# Patient Record
Sex: Male | Born: 1940 | Race: Black or African American | Hispanic: No | Marital: Married | Smoking: Never smoker
Health system: Southern US, Community
[De-identification: ages and names within clinical notes are randomized; demographics above are authoritative.]

## PROBLEM LIST (undated history)

## (undated) DIAGNOSIS — N529 Male erectile dysfunction, unspecified: Secondary | ICD-10-CM

## (undated) DIAGNOSIS — K5900 Constipation, unspecified: Secondary | ICD-10-CM

## (undated) HISTORY — PX: FOOT SURGERY: SHX648

## (undated) HISTORY — DX: Constipation, unspecified: K59.00

## (undated) HISTORY — PX: SHOULDER SURGERY: SHX246

## (undated) HISTORY — DX: Male erectile dysfunction, unspecified: N52.9

---

## 2004-11-05 ENCOUNTER — Ambulatory Visit (HOSPITAL_COMMUNITY): Admission: RE | Admit: 2004-11-05 | Discharge: 2004-11-05 | Payer: Self-pay | Admitting: Gastroenterology

## 2013-08-12 DIAGNOSIS — IMO0002 Reserved for concepts with insufficient information to code with codable children: Secondary | ICD-10-CM | POA: Insufficient documentation

## 2013-08-12 DIAGNOSIS — E538 Deficiency of other specified B group vitamins: Secondary | ICD-10-CM | POA: Insufficient documentation

## 2013-08-12 DIAGNOSIS — I10 Essential (primary) hypertension: Secondary | ICD-10-CM | POA: Insufficient documentation

## 2013-08-12 DIAGNOSIS — E785 Hyperlipidemia, unspecified: Secondary | ICD-10-CM | POA: Insufficient documentation

## 2013-08-12 DIAGNOSIS — M545 Low back pain, unspecified: Secondary | ICD-10-CM | POA: Insufficient documentation

## 2013-08-20 ENCOUNTER — Telehealth: Payer: Self-pay | Admitting: *Deleted

## 2013-08-20 NOTE — Telephone Encounter (Signed)
Having pain in my right foot.  In my right big toe has fungus.  Dr. Paulla Dolly is out of the office.  I was wondering if I could see another doctor.  I returned his call.  He stated he has some toenails that have fungus in them.  He stated the big toenail is starting to bother him and it has white stuff underneath it.  I transferred him to a scheduler for an appointment.

## 2013-08-21 ENCOUNTER — Ambulatory Visit (INDEPENDENT_AMBULATORY_CARE_PROVIDER_SITE_OTHER): Payer: Medicare Other | Admitting: Podiatry

## 2013-08-21 ENCOUNTER — Encounter: Payer: Self-pay | Admitting: Podiatry

## 2013-08-21 VITALS — BP 163/92 | HR 78 | Resp 16 | Ht 77.0 in | Wt 258.0 lb

## 2013-08-21 DIAGNOSIS — L608 Other nail disorders: Secondary | ICD-10-CM

## 2013-08-21 DIAGNOSIS — M79609 Pain in unspecified limb: Secondary | ICD-10-CM

## 2013-08-21 DIAGNOSIS — M79676 Pain in unspecified toe(s): Secondary | ICD-10-CM

## 2013-08-21 NOTE — Patient Instructions (Signed)

## 2013-08-21 NOTE — Progress Notes (Signed)
   Subjective:    Patient ID: Douglas Proctor, male    DOB: Jun 30, 1940, 73 y.o.   MRN: [id number]  HPI Comments: "I have pain in that big toe"  Patient c/o tenderness around 1st toenail right foot for several months. The toenail is thick and dark. Sore with shoes. He just tries to keep trimmed and filed down.     Review of Systems  Musculoskeletal: Positive for arthralgias.  Skin:       Change in nails  Allergic/Immunologic: Positive for food allergies.  All other systems reviewed and are negative.      Objective:   Physical Exam: I have reviewed his past medical history medications allergies surgeries social history and review of systems. Pulses are strongly palpable bilateral. Neurologic sensorium is intact per since once the monofilament. Deep tendon reflexes are intact bilateral. Muscle strength is 5 over 5 dorsiflexors plantar flexors inverters everters all intrinsic musculature is intact. Orthopedic evaluation demonstrates all joints distal to the ankle a full range of motion without crepitus. He has mild hammertoe deformities bilateral that are asymptomatic. Cutaneous evaluation demonstrates thick yellow dystrophic possibly mycotic nails bilateral. These are painful palpation as well as debridement.        Assessment & Plan:  Assessment: Hammertoe deformities. Painful dystrophic nails rule out onychomycosis.  Plan: Also taken today to be sent for pathology evaluation I will followup with him once those come in.

## 2013-08-22 ENCOUNTER — Telehealth: Payer: Self-pay | Admitting: *Deleted

## 2013-08-22 NOTE — Telephone Encounter (Signed)
Mycotic toenail from hallux right foot was sent to [facility] for fungal culture and pas stain.    [facility] [address] [place] [address] Ph:  [phone]

## 2013-09-05 ENCOUNTER — Encounter: Payer: Self-pay | Admitting: Podiatry

## 2013-09-20 ENCOUNTER — Ambulatory Visit (INDEPENDENT_AMBULATORY_CARE_PROVIDER_SITE_OTHER): Payer: Medicare Other | Admitting: Podiatry

## 2013-09-20 ENCOUNTER — Encounter: Payer: Self-pay | Admitting: Podiatry

## 2013-09-20 VITALS — BP 138/90 | HR 70 | Resp 12

## 2013-09-20 DIAGNOSIS — Z79899 Other long term (current) drug therapy: Secondary | ICD-10-CM

## 2013-09-20 LAB — CBC WITH DIFFERENTIAL/PLATELET
Basophils Absolute: 0 10*3/uL (ref 0.0–0.1)
Basophils Relative: 1 % (ref 0–1)
Eosinophils Absolute: 0.2 10*3/uL (ref 0.0–0.7)
Eosinophils Relative: 5 % (ref 0–5)
HCT: 40.3 % (ref 39.0–52.0)
Hemoglobin: 14.3 g/dL (ref 13.0–17.0)
Lymphocytes Relative: 45 % (ref 12–46)
Lymphs Abs: 2.2 10*3/uL (ref 0.7–4.0)
MCH: 31.8 pg (ref 26.0–34.0)
MCHC: 35.5 g/dL (ref 30.0–36.0)
MCV: 89.6 fL (ref 78.0–100.0)
Monocytes Absolute: 0.4 10*3/uL (ref 0.1–1.0)
Monocytes Relative: 8 % (ref 3–12)
Neutro Abs: 2 10*3/uL (ref 1.7–7.7)
Neutrophils Relative %: 41 % — ABNORMAL LOW (ref 43–77)
Platelets: 227 10*3/uL (ref 150–400)
RBC: 4.5 MIL/uL (ref 4.22–5.81)
RDW: 14.2 % (ref 11.5–15.5)
WBC: 4.9 10*3/uL (ref 4.0–10.5)

## 2013-09-20 LAB — HEPATIC FUNCTION PANEL
ALT: 23 U/L (ref 0–53)
AST: 24 U/L (ref 0–37)
Albumin: 4.2 g/dL (ref 3.5–5.2)
Alkaline Phosphatase: 70 U/L (ref 39–117)
Bilirubin, Direct: 0.1 mg/dL (ref 0.0–0.3)
Indirect Bilirubin: 0.3 mg/dL (ref 0.2–1.2)
Total Bilirubin: 0.4 mg/dL (ref 0.2–1.2)
Total Protein: 7.4 g/dL (ref 6.0–8.3)

## 2013-09-20 MED ORDER — TERBINAFINE HCL 250 MG PO TABS: 250.0000 mg | ORAL_TABLET | Freq: Every day | ORAL | Status: DC

## 2013-09-20 NOTE — Progress Notes (Signed)
He presents today for toenail culture report.  Objective: Vital signs are stable he is alert and oriented x3 pulses remain palpable bilateral. Positive fungal culture for onychomycosis.  Assessment: Onychomycosis bilateral.  Plan: Started him a Lamisil today 250 mg tablets 1 by mouth daily #30 with no refill. Also requested blood work CBC and liver profile. Should this come back abnormal we will notify him immediately otherwise I will followup with him in one month at which time another prescription for medication will be written as well as a requisition for more blood work.

## 2013-09-24 ENCOUNTER — Telehealth: Payer: Self-pay | Admitting: *Deleted

## 2013-09-24 NOTE — Telephone Encounter (Signed)
Spoke to patient regarding bloodwork.

## 2013-09-24 NOTE — Telephone Encounter (Signed)
Message copied by Dierdre Searles on Mon Sep 24, 2013  2:13 PM ------      Message from: HYATT, MAX T      Created: Mon Sep 24, 2013  8:59 AM       Blood work looks good. ------

## 2013-10-23 ENCOUNTER — Ambulatory Visit (INDEPENDENT_AMBULATORY_CARE_PROVIDER_SITE_OTHER): Payer: Medicare Other | Admitting: Podiatry

## 2013-10-23 ENCOUNTER — Encounter: Payer: Self-pay | Admitting: Podiatry

## 2013-10-23 VITALS — BP 150/80 | HR 91 | Resp 16

## 2013-10-23 DIAGNOSIS — Z79899 Other long term (current) drug therapy: Secondary | ICD-10-CM

## 2013-10-23 LAB — CBC WITH DIFFERENTIAL/PLATELET
Basophils Absolute: 0 10*3/uL (ref 0.0–0.1)
Basophils Relative: 1 % (ref 0–1)
Eosinophils Absolute: 0.2 10*3/uL (ref 0.0–0.7)
Eosinophils Relative: 5 % (ref 0–5)
HCT: 41.8 % (ref 39.0–52.0)
Hemoglobin: 14.5 g/dL (ref 13.0–17.0)
Lymphocytes Relative: 39 % (ref 12–46)
Lymphs Abs: 1.9 10*3/uL (ref 0.7–4.0)
MCH: 31 pg (ref 26.0–34.0)
MCHC: 34.7 g/dL (ref 30.0–36.0)
MCV: 89.5 fL (ref 78.0–100.0)
Monocytes Absolute: 0.4 10*3/uL (ref 0.1–1.0)
Monocytes Relative: 8 % (ref 3–12)
Neutro Abs: 2.3 10*3/uL (ref 1.7–7.7)
Neutrophils Relative %: 47 % (ref 43–77)
Platelets: 236 10*3/uL (ref 150–400)
RBC: 4.67 MIL/uL (ref 4.22–5.81)
RDW: 14 % (ref 11.5–15.5)
WBC: 4.8 10*3/uL (ref 4.0–10.5)

## 2013-10-23 MED ORDER — TERBINAFINE HCL 250 MG PO TABS: 250.0000 mg | ORAL_TABLET | Freq: Every day | ORAL | Status: DC

## 2013-10-23 NOTE — Progress Notes (Signed)
He presents today for followup of his Lamisil therapy he has taken the first 30 days. He denies fever chills nausea vomiting muscle aches pains itching or rashes.  Objective: Vital signs are stable he is alert oriented x3. There is no erythema edema cellulitis drainage or odor no change in nail plates as of yet.  Assessment: Onychomycosis bilateral.  Plan: Continue Lamisil therapy for another 90 days. I will followup with him in 4 months. Blood work be performed consisting of a liver profile and CBC will notify him should this come back abnormal.

## 2013-10-24 LAB — HEPATIC FUNCTION PANEL
ALT: 30 U/L (ref 0–53)
AST: 28 U/L (ref 0–37)
Albumin: 4.5 g/dL (ref 3.5–5.2)
Alkaline Phosphatase: 66 U/L (ref 39–117)
Bilirubin, Direct: 0.1 mg/dL (ref 0.0–0.3)
Indirect Bilirubin: 0.2 mg/dL (ref 0.2–1.2)
Total Bilirubin: 0.3 mg/dL (ref 0.2–1.2)
Total Protein: 7.7 g/dL (ref 6.0–8.3)

## 2013-10-30 ENCOUNTER — Telehealth: Payer: Self-pay | Admitting: *Deleted

## 2013-10-30 NOTE — Telephone Encounter (Signed)
I called and left the patient a message that Dr. Milinda Pointer said your blood work looks good.  You can continue your medication.

## 2013-10-30 NOTE — Telephone Encounter (Signed)
Message copied by Lolita Rieger on Tue Oct 30, 2013 11:56 AM ------      Message from: Clovis Riley E      Created: Thu Oct 25, 2013  1:47 PM                   ----- Message -----         From: Garrel Ridgel, DPM         Sent: 10/24/2013  12:05 PM           To: Avanell Shackleton Prevette, PMAC            Blood work looks good. Continue medication. ------

## 2013-11-14 DIAGNOSIS — E119 Type 2 diabetes mellitus without complications: Secondary | ICD-10-CM | POA: Insufficient documentation

## 2013-11-14 DIAGNOSIS — R7303 Prediabetes: Secondary | ICD-10-CM | POA: Insufficient documentation

## 2014-02-19 ENCOUNTER — Ambulatory Visit: Payer: Medicare Other | Admitting: Podiatry

## 2015-05-26 ENCOUNTER — Ambulatory Visit (INDEPENDENT_AMBULATORY_CARE_PROVIDER_SITE_OTHER): Payer: Medicare Other

## 2015-05-26 ENCOUNTER — Encounter: Payer: Self-pay | Admitting: Podiatry

## 2015-05-26 ENCOUNTER — Ambulatory Visit (INDEPENDENT_AMBULATORY_CARE_PROVIDER_SITE_OTHER): Payer: Medicare Other | Admitting: Podiatry

## 2015-05-26 VITALS — BP 164/87 | HR 77 | Resp 16

## 2015-05-26 DIAGNOSIS — M79676 Pain in unspecified toe(s): Secondary | ICD-10-CM

## 2015-05-26 DIAGNOSIS — L600 Ingrowing nail: Secondary | ICD-10-CM | POA: Diagnosis not present

## 2015-05-26 DIAGNOSIS — M898X9 Other specified disorders of bone, unspecified site: Secondary | ICD-10-CM

## 2015-05-26 NOTE — Patient Instructions (Signed)

## 2015-05-27 NOTE — Progress Notes (Signed)
Subjective:     Patient ID: Douglas Proctor, male   DOB: 03-Mar-1940, 75 y.o.   MRN: [id number]  HPI patient presents stating my surgery spine from previously but I developed an ingrown toenail my left big toe that get sore couple months and I've tried to trim it and soak it   Review of Systems  All other systems reviewed and are negative.      Objective:   Physical Exam  Constitutional: He is oriented to person, place, and time.  Cardiovascular: Intact distal pulses.   Musculoskeletal: Normal range of motion.  Neurological: He is oriented to person, place, and time.  Skin: Skin is warm.  Nursing note and vitals reviewed.  neurovascular status intact muscle strength adequate range of motion within normal limits with patient found to have good digital perfusion well oriented 3. Patient's found to have incurvated medial border left hallux that's painful when pressed and makes wearing shoe gear difficult with distal irritation of the tissue     Assessment:     Ingrown toenail deformity left hallux medial border with pain    Plan:     H&P condition reviewed with patient and recommended procedure. Explained risk of procedure and went ahead today and infiltrated the left hallux 60 mg like Marcaine mixture remove the medial border exposed matrix and applied phenol 3 applications 30 seconds followed by alcohol lavage and sterile dressing. Gave instructions on soaks and reappoint

## 2015-06-03 ENCOUNTER — Telehealth: Payer: Self-pay | Admitting: *Deleted

## 2015-06-03 NOTE — Telephone Encounter (Signed)
Called patient at [phone] (Home #) to check to see how they were doing from their ingrown toenail procedure that was performed on Monday, May 26, 2015. Pt stated, "Feeling good and has no pain". Pt is soaking toe with some relief.

## 2016-03-02 ENCOUNTER — Emergency Department (HOSPITAL_COMMUNITY): Payer: Medicare Other

## 2016-03-02 ENCOUNTER — Emergency Department (HOSPITAL_COMMUNITY)
Admission: EM | Admit: 2016-03-02 | Discharge: 2016-03-02 | Disposition: A | Discharge status: Home / Self Care | Payer: Medicare Other | Attending: Emergency Medicine | Admitting: Emergency Medicine

## 2016-03-02 DIAGNOSIS — I10 Essential (primary) hypertension: Secondary | ICD-10-CM | POA: Insufficient documentation

## 2016-03-02 DIAGNOSIS — Z23 Encounter for immunization: Secondary | ICD-10-CM | POA: Insufficient documentation

## 2016-03-02 DIAGNOSIS — Y939 Activity, unspecified: Secondary | ICD-10-CM | POA: Diagnosis not present

## 2016-03-02 DIAGNOSIS — E119 Type 2 diabetes mellitus without complications: Secondary | ICD-10-CM | POA: Diagnosis not present

## 2016-03-02 DIAGNOSIS — S0181XA Laceration without foreign body of other part of head, initial encounter: Secondary | ICD-10-CM | POA: Diagnosis not present

## 2016-03-02 DIAGNOSIS — Z79899 Other long term (current) drug therapy: Secondary | ICD-10-CM | POA: Diagnosis not present

## 2016-03-02 DIAGNOSIS — S0990XA Unspecified injury of head, initial encounter: Secondary | ICD-10-CM

## 2016-03-02 DIAGNOSIS — Y999 Unspecified external cause status: Secondary | ICD-10-CM | POA: Insufficient documentation

## 2016-03-02 DIAGNOSIS — Y929 Unspecified place or not applicable: Secondary | ICD-10-CM | POA: Diagnosis not present

## 2016-03-02 DIAGNOSIS — W228XXA Striking against or struck by other objects, initial encounter: Secondary | ICD-10-CM | POA: Diagnosis not present

## 2016-03-02 DIAGNOSIS — R55 Syncope and collapse: Secondary | ICD-10-CM | POA: Diagnosis not present

## 2016-03-02 DIAGNOSIS — S0993XA Unspecified injury of face, initial encounter: Secondary | ICD-10-CM | POA: Diagnosis not present

## 2016-03-02 LAB — CBG MONITORING, ED: Glucose-Capillary: 136 mg/dL — ABNORMAL HIGH (ref 65–99)

## 2016-03-02 LAB — I-STAT CHEM 8, ED
BUN: 17 mg/dL (ref 6–20)
Calcium, Ion: 1.19 mmol/L (ref 1.15–1.40)
Chloride: 103 mmol/L (ref 101–111)
Creatinine, Ser: 1.2 mg/dL (ref 0.61–1.24)
Glucose, Bld: 147 mg/dL — ABNORMAL HIGH (ref 65–99)
HCT: 46 % (ref 39.0–52.0)
Hemoglobin: 15.6 g/dL (ref 13.0–17.0)
Potassium: 3.6 mmol/L (ref 3.5–5.1)
Sodium: 141 mmol/L (ref 135–145)
TCO2: 24 mmol/L (ref 0–100)

## 2016-03-02 MED ORDER — TETANUS-DIPHTH-ACELL PERTUSSIS 5-2.5-18.5 LF-MCG/0.5 IM SUSP
0.5000 mL | Freq: Once | INTRAMUSCULAR | Status: AC
  Administered 2016-03-02: 0.5 mL via INTRAMUSCULAR
  Filled 2016-03-02: qty 0.5

## 2016-03-02 MED ORDER — LIDOCAINE-EPINEPHRINE (PF) 2 %-1:200000 IJ SOLN
20.0000 mL | Freq: Once | INTRAMUSCULAR | Status: AC
  Administered 2016-03-02: 20 mL
  Filled 2016-03-02: qty 20

## 2016-03-02 NOTE — ED Notes (Signed)
Patient transported to CT 

## 2016-03-02 NOTE — Discharge Instructions (Signed)
Suture removal in 7 days  Antibiotic ointment to the wound twice a day  Return to the ER for signs of infection as we discussed

## 2016-03-02 NOTE — ED Provider Notes (Signed)
[facility] DEPT Provider Note   CSN: [id number] Arrival date & time: 03/02/16  1132     History   Chief Complaint No chief complaint on file.   HPI Douglas Proctor is a 76 y.o. male.  HPI Patient presents the emergency department after a syncopal episode where he felt struck his head resulting in laceration of his right forehead.  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  In the [place] the room and stated it felt very hot in the room he became sweaty without chest pain or palpitations. The next thing the pt knew he was laying on the floor.  He also vomited on himself.  He denies nausea vomiting now.  No chest pain or shortness breath.  No palpitations.  He reports mild headache at this time.  He is not on anticoagulants.  He presents with a stellate laceration of his right forehead.  Denies neck pain.  No weakness of his arms or legs   No past medical history on file.  Patient Active Problem List   Diagnosis Date Noted  . 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 Controlled type 2 diabetes mellitus without complication ([place]) 0000000  . Essential (primary) hypertension 08/12/2013  . HLD (hyperlipidemia) 08/12/2013  . LBP (low back pain) 08/12/2013  . B12 deficiency 08/12/2013  . Excess or deficiency of vitamin D 08/12/2013    No past surgical history on file.     Home Medications    Prior to Admission medications   Medication Sig Start Date End Date Taking? Authorizing Provider  amLODipine (NORVASC) 10 MG tablet Take 10 mg by mouth daily.    Historical Provider, MD  atorvastatin (LIPITOR) 10 MG tablet Take 10 mg by mouth daily.    Historical Provider, MD  ibuprofen (ADVIL,MOTRIN) 800 MG tablet  05/07/15   Historical Provider, MD  valsartan-hydrochlorothiazide (DIOVAN-HCT) 320-12.5 MG per tablet Take 1 tablet by mouth daily.    Historical Provider, MD  XALATAN 0.005 % ophthalmic solution  05/07/15   Historical Provider, MD    Family  History No family history on file.  Social History Social History  Substance Use Topics  . Smoking status: Never Smoker  . Smokeless tobacco: Not on file  . Alcohol use Yes     Allergies   Lisinopril; Other; and Oysters [shellfish allergy]   Review of Systems Review of Systems  All other systems reviewed and are negative.    Physical Exam Updated Vital Signs BP 133/92 (BP Location: Left Arm)   Pulse (!) 88  Temp 99 F (37.2 C) (Oral)   Resp 22   Ht 6\' 5"  (1.956 m)   Wt 260 lb (117.9 kg)   SpO2 96%   BMI 30.83 kg/m   Physical Exam  Constitutional: He is oriented to person, place, and time. He appears well-developed and well-nourished.  HENT:  Head: Normocephalic.  Stellate nonbleeding laceration of his right forehead.  Extra movements are normal.  No trismus or malocclusion  Eyes: EOM are normal.  Neck: Normal range of motion.  Cardiovascular: Normal rate, regular rhythm, normal heart sounds and intact distal pulses.   Pulmonary/Chest: Effort normal and breath sounds normal. No respiratory distress.  Abdominal: Soft. He exhibits no distension. There is no tenderness.  Musculoskeletal: Normal range of motion.  Neurological: He is alert and oriented to person, place, and time.  Skin: Skin is warm and dry.  Psychiatric: He has a normal mood and affect. Judgment normal.  Nursing note and  vitals reviewed.    ED Treatments / Results  Labs (all labs ordered are listed, but only abnormal results are displayed) Labs Reviewed  CBG MONITORING, ED - Abnormal; Notable for the following:       Result Value   Glucose-Capillary 136 (*)    All other components within normal limits  I-STAT CHEM 8, ED - Abnormal; Notable for the following:    Glucose, Bld 147 (*)    All other components within normal limits    EKG  EKG Interpretation  Date/Time:  Tuesday March 02 2016 11:55:20 EST Ventricular Rate:  88 PR Interval:    QRS Duration: 109 QT Interval:  381 QTC  Calculation: 461 R Axis:   -24 Text Interpretation:  Sinus rhythm Probable left atrial enlargement Borderline left axis deviation Borderline T abnormalities, anterior leads No old tracing to compare Confirmed by Douglas Smick  MD, Douglas Proctor ([id number]) on 03/02/2016 12:02:01 PM       Radiology Ct Head Wo Contrast  Result Date: 03/02/2016 CLINICAL DATA:  Nausea and vomiting, recent fall hitting the head EXAM: CT HEAD WITHOUT CONTRAST TECHNIQUE: Contiguous axial images were obtained from the base of the skull through the vertex without intravenous contrast. COMPARISON:  None. FINDINGS: Brain: The ventricular system is within normal limits in size for age and the septum is midline in position. Only mild cortical atrophy is present for age. Benign-appearing bilateral basal ganglial calcifications are noted. No hemorrhage, mass lesion, or acute infarction is seen. Vascular: On this unenhanced study, no vascular abnormality is noted. Skull: On bone window images, no calvarial fracture is seen. Sinuses/Orbits: The paranasal sinuses are visualized are pneumatized. Other: A right frontal scalp hematoma is noted. IMPRESSION: 1. Minimal atrophy for age.  No acute intracranial abnormality. 2. Small right frontal scalp hematoma. Electronically Signed   By: Douglas Proctor M.D.   On: 03/02/2016 12:18    Procedures Procedures (including critical care time)  +++++++++++++++++++++++++++++++++++++++++++  LAYERED LACERATION REPAIR Performed by: Hoy Morn Consent: Verbal consent obtained. Risks and benefits: risks, benefits and alternatives were discussed Patient identity confirmed: provided demographic data Time out performed prior to procedure Prepped and Draped in normal sterile fashion Wound explored COMPLEX WOUND LACERATION LAYERED WOUND REPAIR Laceration Location: forehead Laceration Length: 4cm No Foreign Bodies seen or palpated Anesthesia: local infiltration Local anesthetic: lidocaine 2% with  epinephrine Anesthetic total: 8 ml Irrigation method: syringe Amount of cleaning: standard Skin closure: 5-0 vicryl and 5-0 prolene Number of sutures or staples: 4 deep sutures, 9 superficial sutures Technique: simple interrupted  Patient tolerance: Patient tolerated the procedure well with no immediate complications.   +++++++++++++++++++++++++++++++++++++++++++++++++   Medications Ordered in ED Medications  lidocaine-EPINEPHrine (XYLOCAINE W/EPI) 2 %-1:200000 (PF) injection 20 mL (20 mLs Infiltration Given by Other 03/02/16 1252)  Tdap (BOOSTRIX) injection 0.5 mL (0.5 mLs Intramuscular Given 03/02/16 1411)     Initial Impression / Assessment and Plan / ED Course  I have reviewed the triage vital signs and the nursing notes.  Pertinent labs & imaging results that were available during my care of the patient were reviewed by me and considered in my medical decision making (see chart for details).  Clinical Course     Patient is overall well-appearing.  Likely vasovagal syncope.  Laceration repaired.  Head injury negative   Final Clinical Impressions(s) / ED Diagnoses   Final diagnoses:  Syncope, unspecified syncope type  Injury of head, initial encounter  Forehead laceration, initial encounter    New Prescriptions New Prescriptions  No medications on file     Jola Schmidt, MD 03/02/16 1413

## 2016-03-02 NOTE — ED Triage Notes (Signed)
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 Pt started feeling nauseous and hot. Pt stated when he tried to get down off the stool he started vomiting and it caused him to fall over and hit his head on the floor. EMS states pt has a laceration over his right eye. EMS reports pt is AO x 4 and denies pain at this time.

## 2016-03-02 NOTE — ED Notes (Signed)
Bed: [id number] Expected date:  Expected time:  Means of arrival:  Comments: EMS-fall/LOC

## 2019-03-27 ENCOUNTER — Ambulatory Visit (INDEPENDENT_AMBULATORY_CARE_PROVIDER_SITE_OTHER): Payer: Medicare Other | Admitting: Cardiovascular Disease

## 2019-03-27 ENCOUNTER — Encounter: Payer: Self-pay | Admitting: Cardiovascular Disease

## 2019-03-27 ENCOUNTER — Other Ambulatory Visit: Payer: Self-pay

## 2019-03-27 VITALS — BP 148/80 | HR 96 | Temp 98.1°F | Ht 77.0 in | Wt 272.2 lb

## 2019-03-27 DIAGNOSIS — E7800 Pure hypercholesterolemia, unspecified: Secondary | ICD-10-CM

## 2019-03-27 DIAGNOSIS — I10 Essential (primary) hypertension: Secondary | ICD-10-CM

## 2019-03-27 DIAGNOSIS — Z5181 Encounter for therapeutic drug level monitoring: Secondary | ICD-10-CM | POA: Diagnosis not present

## 2019-03-27 MED ORDER — SPIRONOLACTONE 25 MG PO TABS: 25.0000 mg | ORAL_TABLET | Freq: Every day | ORAL | 3 refills | Status: DC

## 2019-03-27 NOTE — Progress Notes (Signed)
Cardiology Office Note   Date:  03/27/2019   ID:  Douglas Proctor, DOB [birth year], MRN [id number]  PCP:  [facility]  Cardiologist:   [name], MD   No chief complaint on file.     History of Present Illness: Douglas Proctor is a 79 y.o. male with hypertension, hyperlipidemia, and who is being seen today for the evaluation of hypertension at the request of Sharmaine Base, [place]*.  He was first diagnosed with hypertension in the 1970s.  It was previously well-controlled prior to the pandemic.  However lately he has not been able to go to the gym.  He stopped going due to fear of getting Covid.  Since that time his blood pressure has started to rise.  He also gained 15 pounds.  Overall he has been feeling well.  He has no exertional chest pain or shortness of breath.  He does have some lower extremity edema that is controlled with compression socks.  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  He denies orthopnea or PND.  He has been seen at [facility], most recently 01/30/19.  At that time his BP was in the 150s/90s.  Terazosin was added to his regimen.  Since that his blood pressure continues to be elevated.  At home it has been in the 130s to the [id number] systolic.  His blood pressure machine is comparable to the readings we obtained in the office.  Of note he reports that 2 of his brothers developed diabetes soon after starting metoprolol.    History reviewed. No pertinent past medical history.  History reviewed. No pertinent surgical history.   Current Outpatient Medications  Medication Sig Dispense Refill  . amLODipine (NORVASC) 10 MG tablet Take 10 mg by mouth daily.    Marland Kitchen ascorbic acid (VITAMIN C) 500 MG/5ML syrup Take by mouth daily.    Marland Kitchen aspirin EC 81 MG tablet Take 81 mg by mouth daily.    Marland Kitchen atorvastatin (LIPITOR) 10 MG tablet Take 10 mg by mouth daily.    Marland Kitchen ibuprofen (ADVIL,MOTRIN) 800 MG tablet Take 800 mg by  mouth every 6 (six) hours as needed for headache or moderate pain.     . Multiple Vitamin (MULTIVITAMIN ADULT PO) Take by mouth.    . valsartan-hydrochlorothiazide (DIOVAN-HCT) 320-12.5 MG per tablet Take 1 tablet by mouth daily.    . vitamin E 200 UNIT capsule Take 200 Units by mouth daily.    Marland Kitchen XALATAN 0.005 % ophthalmic solution Place 1 drop into both eyes at bedtime.     Marland Kitchen REFRESH 1.4-0.6 % ophthalmic solution Place 1-2 drops into both eyes at bedtime as needed (dry eyes).     Marland Kitchen spironolactone (ALDACTONE) 25 MG tablet Take 1 tablet (25 mg total) by mouth daily. 90 tablet 3   No current facility-administered medications for this visit.    Allergies:   Lisinopril, Other, and Oysters [shellfish allergy]    Social History:  The patient  reports that he has never smoked. He has never used smokeless tobacco. He reports current alcohol use of about 1.0 standard drinks of alcohol per week.   Family History:  The patient's family history includes Bradycardia in his sister; CAD in his father.    ROS:  Please see the history of present illness.   Otherwise, review of systems are positive for none.   All other systems are reviewed and negative.    PHYSICAL EXAM: VS:  BP (!) 148/80   Pulse 96   Temp 98.1 F (36.7 C)   Ht 6\' 5"  (1.956 m)   Wt 272 lb 3.2 oz (123.5 kg)   SpO2 99%   BMI 32.28 kg/m  , BMI Body mass index is 32.28 kg/m. GENERAL:  Well appearing HEENT:  Pupils equal round and reactive, fundi not visualized, oral mucosa unremarkable NECK:  No jugular venous distention, waveform within normal limits, carotid upstroke brisk and symmetric, no bruits LUNGS:  Clear to auscultation bilaterally HEART:  RRR.  PMI not displaced or sustained,S1 and S2 within normal limits, no S3, no S4, no clicks, no rubs, no murmurs ABD:  Flat, positive bowel sounds normal in frequency in pitch, no bruits, no rebound, no guarding, no midline pulsatile mass, no hepatomegaly, no splenomegaly EXT:  2 plus  pulses throughout, no edema, no cyanosis no clubbing SKIN:  No rashes no nodules NEURO:  Cranial nerves II through XII grossly intact, motor grossly intact throughout PSYCH:  Cognitively intact, oriented to person place and time   EKG:  EKG is not ordered today.   Recent Labs: No results found for requested labs within last 8760 hours.    Lipid Panel No results found for: CHOL, TRIG, HDL, CHOLHDL, VLDL, LDLCALC, LDLDIRECT    Wt Readings from Last 3 Encounters:  03/27/19 272 lb 3.2 oz (123.5 kg)  03/02/16 260 lb (117.9 kg)  08/21/13 258 lb (117 kg)      ASSESSMENT AND PLAN:  # Essential hypertension:   Blood pressure remains poorly controlled.  It has not changed much since he was started on Terazosin.  We will stop this medication and start spironolactone 25 mg daily.  Check a basic metabolic panel in 1 week.   Continue amlodipine, losartan, and hydrochlorothiazide.  Of note, he reports that his blood pressure was also better controlled before valsartan and hydrochlorothiazide were switched into separate tablets.  Avoid metoprolol as it caused his brothers to develop diabetes.  # Pure hypercholesterolemia: Lipids are well-controlled on atorvastatin.  # CV Disease Prevention:  Reduce aspirin to 81mg .    Current medicines are reviewed at length with the patient today.  The patient does not have concerns regarding medicines.  The following changes have been made:  no change  Labs/ tests ordered today include:   Orders Placed This Encounter  Procedures  . Basic metabolic panel     Disposition:   FU with Abdel Effinger C. Oval Linsey, MD, [facility] in 1 month.     Signed, Leisl Spurrier C. Oval Linsey, MD, [facility]  03/27/2019 1:12 PM    [place]

## 2019-03-27 NOTE — Patient Instructions (Addendum)
Medication Instructions:  STOP TERAZOSIN   STAR SPIRONOLACTONE 25 MG DAILY   STOP ASPRIN 325 MG   START ASPIRIN 81 MG DAILY   *If you need a refill on your cardiac medications before your next appointment, please call your pharmacy*  Lab Work: BMET IN 1 WEEK   If you have labs (blood work) drawn today and your tests are completely normal, you will receive your results only by: Marland Kitchen MyChart Message (if you have MyChart) OR . A paper copy in the mail If you have any lab test that is abnormal or we need to change your treatment, we will call you to review the results.  Testing/Procedures: NONE  Follow-Up: At [facility], you and your health needs are our priority.  As part of our continuing mission to provide you with exceptional heart care, we have created designated Provider Care Teams.  These Care Teams include your primary Cardiologist (physician) and Advanced Practice Providers (APPs -  Physician Assistants and Nurse Practitioners) who all work together to provide you with the care you need, when you need it.  Your next appointment:   4 week(s)  The format for your next appointment:   In Person  Provider:   You may see DR [facility]  or one of the following Advanced Practice Providers on your designated Care Team:    Kerin Ransom, PA-C  [place], [place]  Coletta Memos, [place]

## 2019-04-07 LAB — BASIC METABOLIC PANEL
BUN/Creatinine Ratio: 16 (ref 10–24)
BUN: 16 mg/dL (ref 8–27)
CO2: 24 mmol/L (ref 20–29)
Calcium: 9.6 mg/dL (ref 8.6–10.2)
Chloride: 104 mmol/L (ref 96–106)
Creatinine, Ser: 1.03 mg/dL (ref 0.76–1.27)
GFR calc Af Amer: 81 mL/min/{1.73_m2} (ref >59)
GFR calc non Af Amer: 70 mL/min/{1.73_m2} (ref >59)
Glucose: 129 mg/dL — ABNORMAL HIGH (ref 65–99)
Potassium: 4.3 mmol/L (ref 3.5–5.2)
Sodium: 145 mmol/L — ABNORMAL HIGH (ref 134–144)

## 2019-04-13 ENCOUNTER — Telehealth: Payer: Self-pay | Admitting: *Deleted

## 2019-04-13 DIAGNOSIS — Z5181 Encounter for therapeutic drug level monitoring: Secondary | ICD-10-CM

## 2019-04-13 DIAGNOSIS — I10 Essential (primary) hypertension: Secondary | ICD-10-CM

## 2019-04-13 NOTE — Telephone Encounter (Signed)
Advised patient of lab results  Order for BMET placed in Epic

## 2019-04-13 NOTE — Telephone Encounter (Signed)
-----   Message from [name], MD sent at 04/10/2019  8:32 AM EST ----- Labs show that he is a little dehydrated but otherwise normal.  Try to increase fluid intake.  Repeat BMP in a month.

## 2019-04-27 ENCOUNTER — Ambulatory Visit (INDEPENDENT_AMBULATORY_CARE_PROVIDER_SITE_OTHER): Payer: Medicare Other | Admitting: Cardiovascular Disease

## 2019-04-27 ENCOUNTER — Encounter: Payer: Self-pay | Admitting: Cardiovascular Disease

## 2019-04-27 ENCOUNTER — Other Ambulatory Visit: Payer: Self-pay

## 2019-04-27 VITALS — BP 134/80 | HR 74 | Ht 77.0 in | Wt 263.0 lb

## 2019-04-27 DIAGNOSIS — Z5181 Encounter for therapeutic drug level monitoring: Secondary | ICD-10-CM | POA: Diagnosis not present

## 2019-04-27 DIAGNOSIS — E7800 Pure hypercholesterolemia, unspecified: Secondary | ICD-10-CM

## 2019-04-27 DIAGNOSIS — I10 Essential (primary) hypertension: Secondary | ICD-10-CM | POA: Diagnosis not present

## 2019-04-27 HISTORY — DX: Essential (primary) hypertension: I10

## 2019-04-27 HISTORY — DX: Pure hypercholesterolemia, unspecified: E78.00

## 2019-04-27 NOTE — Progress Notes (Signed)
Cardiology Office Note   Date:  04/27/2019   ID:  Douglas Proctor, DOB Nov 16, 1940, MRN [id number]  PCP:  [facility]  Cardiologist:   [name], MD   No chief complaint on file.    History of Present Illness: Douglas Proctor is a 79 y.o. male with hypertension and hyperlipidemia here for follow-up.  He was initially seen 03/2019 for management of hypertension. He was first diagnosed with hypertension in the 1970s.  It was previously well-controlled prior to the pandemic.  However lately he has not been able to go to the gym.  Prior to that he was started on Terazosin after his blood pressure was in the 150s over 90s at [facility].  At his last appointment Terazosin was discontinued as he had not seen any improvement in his blood pressure.  He was started on spironolactone instead.  He has tolerated this well.  His blood pressures have been mostly in the 130s to 140s over 80s.  He has started back exercising.  He uses the recumbent bike at least 3 to 4 days/week.  He rides it for 30 minutes and has no exertional chest pain or shortness of breath.  He has some mild lower extremity edema by the end of the day that improves with elevation and compression stockings.  He has no orthopnea or PND.  His wife does most of the cooking and she limits their salt intake.  He continues to help take care of his grandchildren and enjoys doing this.   Past Medical History:  Diagnosis Date  . Essential hypertension 04/27/2019  . Pure hypercholesterolemia 04/27/2019    History reviewed. No pertinent surgical history.   Current Outpatient Medications  Medication Sig Dispense Refill  . amLODipine (NORVASC) 10 MG tablet Take 10 mg by mouth daily.    Marland Kitchen ascorbic acid (VITAMIN C) 500 MG/5ML syrup Take by mouth daily.    Marland Kitchen aspirin EC 81 MG tablet Take 81 mg by mouth daily.    Marland Kitchen atorvastatin (LIPITOR) 10 MG tablet Take 10 mg by mouth daily.    Marland Kitchen ibuprofen (ADVIL,MOTRIN) 800  MG tablet Take 800 mg by mouth every 6 (six) hours as needed for headache or moderate pain.     . Multiple Vitamin (MULTIVITAMIN ADULT PO) Take by mouth.    Marland Kitchen REFRESH 1.4-0.6 % ophthalmic solution Place 1-2 drops into both eyes at bedtime as needed (dry eyes).     Marland Kitchen spironolactone (ALDACTONE) 25 MG tablet Take 1 tablet (25 mg total) by mouth daily. 90 tablet 3  . valsartan-hydrochlorothiazide (DIOVAN-HCT) 320-12.5 MG per tablet Take 1 tablet by mouth daily.    . vitamin E 200 UNIT capsule Take 200 Units by mouth daily.    Marland Kitchen XALATAN 0.005 % ophthalmic solution Place 1 drop into both eyes at bedtime.      No current facility-administered medications for this visit.    Allergies:   Lisinopril, Other, and Oysters [shellfish allergy]    Social History:  The patient  reports that he has never smoked. He has never used smokeless tobacco. He reports current alcohol use of about 1.0 standard drinks of alcohol per week.   Family History:  The patient's family history includes Bradycardia in his sister; CAD in his father.    ROS:  Please see the history of present illness.   Otherwise, review of systems are positive for none.   All other systems are reviewed and negative.    PHYSICAL  EXAM: VS:  BP 134/80   Pulse 74   Ht 6\' 5"  (1.956 m)   Wt 263 lb (119.3 kg)   BMI 31.19 kg/m  , BMI Body mass index is 31.19 kg/m. GENERAL:  Well appearing HEENT:  Pupils equal round and reactive, fundi not visualized, oral mucosa unremarkable NECK:  No jugular venous distention, waveform within normal limits, carotid upstroke brisk and symmetric, no bruits LUNGS:  Clear to auscultation bilaterally HEART:  RRR.  PMI not displaced or sustained,S1 and S2 within normal limits, no S3, no S4, no clicks, no rubs, no murmurs ABD:  Flat, positive bowel sounds normal in frequency in pitch, no bruits, no rebound, no guarding, no midline pulsatile mass, no hepatomegaly, no splenomegaly EXT:  2 plus pulses throughout, no  edema, no cyanosis no clubbing SKIN:  No rashes no nodules NEURO:  Cranial nerves II through XII grossly intact, motor grossly intact throughout PSYCH:  Cognitively intact, oriented to person place and time   EKG:  EKG is ordered today. 04/27/2019: Sinus rhythm.  Rate 74 bpm.  First-degree AV block.  PVCs.  Recent Labs: 04/06/2019: BUN 16; Creatinine, Ser 1.03; Potassium 4.3; Sodium 145    Lipid Panel No results found for: CHOL, TRIG, HDL, CHOLHDL, VLDL, LDLCALC, LDLDIRECT    Wt Readings from Last 3 Encounters:  04/27/19 263 lb (119.3 kg)  03/27/19 272 lb 3.2 oz (123.5 kg)  03/02/16 260 lb (117.9 kg)      ASSESSMENT AND PLAN:  # Essential hypertension:   Blood pressure is improving but still not at goal.  Ideally he should be less than 130/80.  He has been exercising and lost 10 pounds since I last saw him last month.  He notes that his blood pressure is usually much better controlled when he is exercising regularly.  He was encouraged to continue doing this and losing weight.  Now we will continue amlodipine, spironolactone, valsartan, and hydrochlorothiazide.  If his blood pressure remains above goal at follow-up we can consider adding doxazosin.  His sodium was a little elevated when last checked.  We will repeat a basic metabolic panel today.  He was advised to make sure he is drinking plenty of fluids.  # Pure hypercholesterolemia: Lipids are well-controlled on atorvastatin.  # CV Disease Prevention:  Continue aspirin to 81mg  and atorvastatin.    Current medicines are reviewed at length with the patient today.  The patient does not have concerns regarding medicines.  The following changes have been made:  no change  Labs/ tests ordered today include:   Orders Placed This Encounter  Procedures  . Basic metabolic panel  . EKG 12-Lead     Disposition:   FU with [name], MD, [facility] in 2 months.     Signed, Davey Bergsma C. Oval Linsey, MD, [facility]  04/27/2019 1:10  PM    [facility]

## 2019-04-27 NOTE — Patient Instructions (Signed)
Medication Instructions:  Your physician recommends that you continue on your current medications as directed. Please refer to the Current Medication list given to you today.  *If you need a refill on your cardiac medications before your next appointment, please call your pharmacy*  Lab Work: BMET TODAY   Testing/Procedures: NONE   Follow-Up: At [facility], you and your health needs are our priority.  As part of our continuing mission to provide you with exceptional heart care, we have created designated Provider Care Teams.  These Care Teams include your primary Cardiologist (physician) and Advanced Practice Providers (APPs -  Physician Assistants and Nurse Practitioners) who all work together to provide you with the care you need, when you need it.  We recommend signing up for the patient portal called "MyChart".  Sign up information is provided on this After Visit Summary.  MyChart is used to connect with patients for Virtual Visits (Telemedicine).  Patients are able to view lab/test results, encounter notes, upcoming appointments, etc.  Non-urgent messages can be sent to your provider as well.   To learn more about what you can do with MyChart, go to [email or web address].    Your next appointment:   2 month(s)  The format for your next appointment:   In Person  Provider:   You may see DR [facility]  or one of the following Advanced Practice Providers on your designated Care Team:    Kerin Ransom, PA-C  [place], [place]  Coletta Memos, [place]  Other Instructions  WORK ON EXERCISE AND DIET

## 2019-04-28 LAB — BASIC METABOLIC PANEL
BUN/Creatinine Ratio: 13 (ref 10–24)
BUN: 14 mg/dL (ref 8–27)
CO2: 24 mmol/L (ref 20–29)
Calcium: 9.3 mg/dL (ref 8.6–10.2)
Chloride: 102 mmol/L (ref 96–106)
Creatinine, Ser: 1.06 mg/dL (ref 0.76–1.27)
GFR calc Af Amer: 78 mL/min/{1.73_m2} (ref >59)
GFR calc non Af Amer: 67 mL/min/{1.73_m2} (ref >59)
Glucose: 93 mg/dL (ref 65–99)
Potassium: 4.3 mmol/L (ref 3.5–5.2)
Sodium: 141 mmol/L (ref 134–144)

## 2019-06-25 NOTE — Progress Notes (Signed)
Cardiology Clinic Note   Patient Name: Douglas Proctor Date of Encounter: 06/26/2019  Primary Care Provider:  [facility] Primary Cardiologist:  [name], MD  Patient Profile    Douglas Proctor 79 year old male presents the clinic today for follow-up evaluation of his hypertension and hyperlipidemia.  Past Medical History    Past Medical History:  Diagnosis Date  . Essential hypertension 04/27/2019  . Pure hypercholesterolemia 04/27/2019   History reviewed. No pertinent surgical history.  Allergies  Allergies  Allergen Reactions  . Lisinopril Swelling    Lips and face swelling  . Other     Cats and dogs dander   . Oysters [Shellfish Allergy] Other (See Comments)    Unknown exact reaction- was tested    History of Present Illness    [name] has a PMH of HTN and HLD.  He was initially seen by Dr. Oval Linsey on 2/21 for evaluation and management of his hypertension.  He was first diagnosed with HTN in 1970s.  He was previously controlled prior to the COVID-19 pandemic.  He was noted to not be as active due to not being able to visit the gym.  He had been started on terazosin after his blood pressure was in the 150s over 90s at [facility].  His terazosin was later discontinued because he had not seen any improvement with his blood pressure.  He was started on spironolactone.  He tolerated medication well.(!) 149/91His blood pressure was much better controlled and was mostly in the 130s to 140s over 80s.  He had started exercising again as well.  He indicated that he was using a stationary recumbent bike 3-4 times per week for 30 minutes at a time.  This produced no exertional chest pain or shortness of breath.  He was noted to have mild lower extremity edema bilaterally the day.  This responded well to elevation and lower extremity support stockings.  When he was last seen by Dr. Oval Linsey on 04/27/2019 he denied orthopnea and PND.  He indicated  that his wife did most of the cooking and she was limiting her salt intake as well.  He was continuing to take care of his grandchildren at that time.  He presents the clinic today for further evaluation and states he feels well.  He has been monitoring his blood pressure at home and has noticed that it runs in the mid 130s over 80s.  He is increasing his physical activity and tries to stay away from salt in his diet.  He continues to wear his lower extremity support stockings.  He is concerned about side effects of doxazosin.  They were reviewed and the medication was reviewed with hospital pharmacist.  Start doxazosin, salty 6 given, follow-up in 1 month.  Continue blood pressure log.  Today he denies chest pain, shortness of breath, lower extremity edema, fatigue, palpitations, melena, hematuria, hemoptysis, diaphoresis, weakness, presyncope, syncope, orthopnea, and PND.    Home Medications    Prior to Admission medications   Medication Sig Start Date End Date Taking? Authorizing Provider  amLODipine (NORVASC) 10 MG tablet Take 10 mg by mouth daily.    [provider]  ascorbic acid (VITAMIN C) 500 MG/5ML syrup Take by mouth daily.    [provider]  aspirin EC 81 MG tablet Take 81 mg by mouth daily.    [provider]  atorvastatin (LIPITOR) 10 MG tablet Take 10 mg by mouth daily.    [provider]  ibuprofen (ADVIL,MOTRIN) 800 MG tablet Take 800 mg by mouth every 6 (six) hours as needed for headache or moderate pain.  05/07/15   [provider]  Multiple Vitamin (MULTIVITAMIN ADULT PO) Take by mouth.    [provider]  REFRESH 1.4-0.6 % ophthalmic solution Place 1-2 drops into both eyes at bedtime as needed (dry eyes).  02/24/16   [provider]  spironolactone (ALDACTONE) 25 MG tablet Take 1 tablet (25 mg total) by mouth daily. 03/27/19 06/25/19  [name], MD  valsartan-hydrochlorothiazide (DIOVAN-HCT) 320-12.5 MG per  tablet Take 1 tablet by mouth daily.    [provider]  vitamin E 200 UNIT capsule Take 200 Units by mouth daily.    [provider]  XALATAN 0.005 % ophthalmic solution Place 1 drop into both eyes at bedtime.  05/07/15   [provider]    Family History    Family History  Problem Relation Age of Onset  . CAD Father   . Bradycardia Sister    He indicated that his mother is alive. He indicated that his father is deceased. He indicated that the status of his sister is unknown. He indicated that his brother is deceased.  Social History    Social History   Socioeconomic History  . Marital status: Married    Spouse name: Not on file  . Number of children: Not on file  . Years of education: Not on file  . Highest education level: Not on file  Occupational History  . Not on file  Tobacco Use  . Smoking status: Never Smoker  . Smokeless tobacco: Never Used  Substance and Sexual Activity  . Alcohol use: Yes    Alcohol/week: 1.0 standard drinks    Types: 1 Glasses of wine per week  . Drug use: Not on file  . Sexual activity: Not on file  Other Topics Concern  . Not on file  Social History Narrative  . Not on file   Social Determinants of Health   Financial Resource Strain:   . Difficulty of Paying Living Expenses:   Food Insecurity:   . Worried About Charity fundraiser in the Last Year:   . Arboriculturist in the Last Year:   Transportation Needs:   . Film/video editor (Medical):   Marland Kitchen Lack of Transportation (Non-Medical):   Physical Activity:   . Days of Exercise per Week:   . Minutes of Exercise per Session:   Stress:   . Feeling of Stress :   Social Connections:   . Frequency of Communication with Friends and Family:   . Frequency of Social Gatherings with Friends and Family:   . Attends Religious Services:   . Active Member of Clubs or Organizations:   . Attends Archivist Meetings:   Marland Kitchen Marital Status:   Intimate  Partner Violence:   . Fear of Current or Ex-Partner:   . Emotionally Abused:   Marland Kitchen Physically Abused:   . Sexually Abused:      Review of Systems    General:  No chills, fever, night sweats or weight changes.  Cardiovascular:  No chest pain, dyspnea on exertion, edema, orthopnea, palpitations, paroxysmal nocturnal dyspnea. Dermatological: No rash, lesions/masses Respiratory: No cough, dyspnea Urologic: No hematuria, dysuria Abdominal:   No nausea, vomiting, diarrhea, bright red blood per rectum, melena, or hematemesis Neurologic:  No visual changes, wkns, changes in mental status. All other systems reviewed and are otherwise negative except as  noted above.  Physical Exam    VS:  BP (!) 149/91   Pulse 70   Temp 98 F (36.7 C)   Resp 20   Ht 6\' 5"  (1.956 m)   Wt 264 lb (119.7 kg)   SpO2 98%   BMI 31.31 kg/m  , BMI Body mass index is 31.31 kg/m. GEN: Well nourished, well developed, in no acute distress. HEENT: normal. Neck: Supple, no JVD, carotid bruits, or masses. Cardiac: RRR, no murmurs, rubs, or gallops. No clubbing, cyanosis, edema.  Radials/DP/PT 2+ and equal bilaterally.  Respiratory:  Respirations regular and unlabored, clear to auscultation bilaterally. GI: Soft, nontender, nondistended, BS + x 4. MS: no deformity or atrophy. Skin: warm and dry, no rash. Neuro:  Strength and sensation are intact. Psych: Normal affect.  Accessory Clinical Findings    ECG personally reviewed by me today-none today.   EKG 04/27/2018 Sinus rhythm with first-degree AV block and PVCs 74 bpm  Assessment & Plan   1.  Essential hypertension-BP today 149/91.  Goal less than 130/80.  Previously encouraged to continue exercise and weight loss.  Doxazosin considered if blood pressure continue to be poorly controlled. Continue amlodipine, spironolactone, valsartan, and HCTZ Start doxazosin 1 mg at bedtime-reviewed with clinic pharmacist. Heart healthy low-sodium diet-salty 6  given Increase physical activity as tolerated Continue B/p log given   Pure hypercholesterolemia-well controlled. Continue atorvastatin Heart healthy low-sodium high-fiber diet Increase physical activity as tolerated  Disposition: Follow-up with me in 1 month.  Jossie Ng. Pheobe Sandiford NP-C    06/26/2019, 12:24 PM [facility] [address] Office [phone] Fax [phone]

## 2019-06-26 ENCOUNTER — Encounter: Payer: Self-pay | Admitting: General Practice

## 2019-06-26 ENCOUNTER — Ambulatory Visit (INDEPENDENT_AMBULATORY_CARE_PROVIDER_SITE_OTHER): Payer: Medicare Other | Admitting: General Practice

## 2019-06-26 ENCOUNTER — Other Ambulatory Visit: Payer: Self-pay

## 2019-06-26 VITALS — BP 149/91 | HR 70 | Temp 98.0°F | Resp 20 | Ht 77.0 in | Wt 264.0 lb

## 2019-06-26 DIAGNOSIS — E7800 Pure hypercholesterolemia, unspecified: Secondary | ICD-10-CM | POA: Diagnosis not present

## 2019-06-26 DIAGNOSIS — I10 Essential (primary) hypertension: Secondary | ICD-10-CM | POA: Diagnosis not present

## 2019-06-26 MED ORDER — DOXAZOSIN MESYLATE 1 MG PO TABS: 1.0000 mg | ORAL_TABLET | Freq: Every day | ORAL | 0 refills | Status: DC

## 2019-06-26 MED ORDER — DOXAZOSIN MESYLATE 1 MG PO TABS: 1.0000 mg | ORAL_TABLET | Freq: Every day | ORAL | 6 refills | Status: DC

## 2019-06-26 NOTE — Patient Instructions (Signed)
Medication Instructions:  START DOXAZOSIN 1MG  BEFORE BEDTIME *If you need a refill on your cardiac medications before your next appointment, please call your pharmacy*  Special Instructions PLEASE READ AND FOLLOW SALTY 6-ATTACHED  Follow-Up: Your next appointment:  1 month(s)  In Person with Coletta Memos, FNP  At [facility], you and your health needs are our priority.  As part of our continuing mission to provide you with exceptional heart care, we have created designated Provider Care Teams.  These Care Teams include your primary Cardiologist (physician) and Advanced Practice Providers (APPs -  Physician Assistants and Nurse Practitioners) who all work together to provide you with the care you need, when you need it.

## 2019-07-26 ENCOUNTER — Other Ambulatory Visit: Payer: Self-pay

## 2019-07-26 ENCOUNTER — Ambulatory Visit (INDEPENDENT_AMBULATORY_CARE_PROVIDER_SITE_OTHER): Payer: Medicare Other | Admitting: Podiatry

## 2019-07-26 ENCOUNTER — Encounter: Payer: Self-pay | Admitting: Podiatry

## 2019-07-26 ENCOUNTER — Ambulatory Visit (INDEPENDENT_AMBULATORY_CARE_PROVIDER_SITE_OTHER): Payer: Medicare Other

## 2019-07-26 DIAGNOSIS — D169 Benign neoplasm of bone and articular cartilage, unspecified: Secondary | ICD-10-CM

## 2019-07-26 DIAGNOSIS — M2042 Other hammer toe(s) (acquired), left foot: Secondary | ICD-10-CM

## 2019-07-26 DIAGNOSIS — L84 Corns and callosities: Secondary | ICD-10-CM

## 2019-07-26 DIAGNOSIS — M2041 Other hammer toe(s) (acquired), right foot: Secondary | ICD-10-CM

## 2019-07-27 NOTE — Progress Notes (Signed)
Subjective:   Patient ID: Douglas Proctor, male   DOB: 79 y.o.   MRN: [id number]   HPI Patient presents stating he has had painful lesion between the hallux and second toe that is become more sore recently.  States it is sore when he tries to wear shoes he is tried to trim it without relief and he knows he has some deformity of his feet in general.  Patient does not smoke and likes to be active   Review of Systems  All other systems reviewed and are negative.       Objective:  Physical Exam Vitals and nursing note reviewed.  Constitutional:      Appearance: He is well-developed.  Pulmonary:     Effort: Pulmonary effort is normal.  Musculoskeletal:        General: Normal range of motion.  Skin:    General: Skin is warm.  Neurological:     Mental Status: He is alert.     Neurovascular was found to be intact muscle strength was found to be adequate range of motion was within normal limits with moderate equinus condition noted.  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  Today I went ahead and did debridement of the lesion and applied To the toe to try to separate them.  I explained this will be a more temporary procedure but if it gives relief that we could continue this and if not oriented need to consider some form of surgical intervention in this case.  Patient is willing to accept risk and at this time he will try what we did and understands long-term what may be required.  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

## 2019-08-07 NOTE — Progress Notes (Signed)
Cardiology Clinic Note   Patient Name: Douglas Proctor Date of Encounter: 08/08/2019  Primary Care Provider:  [facility] Cardiologist:  [name], MD  Patient Profile    Douglas Proctor 79 year old male presents the clinic today for follow-up evaluation of his hypertension and hyperlipidemia.  Past Medical History    Past Medical History:  Diagnosis Date  . Essential hypertension 04/27/2019  . Pure hypercholesterolemia 04/27/2019   History reviewed. No pertinent surgical history.  Allergies  Allergies  Allergen Reactions  . Lisinopril Swelling    Lips and face swelling  . Other     Cats and dogs dander   . Oysters [Shellfish Allergy] Other (See Comments)    Unknown exact reaction- was tested    History of Present Illness    [name] has a PMH of HTN and HLD.  He was initially seen by Dr. Oval Linsey on 2/21 for evaluation and management of his hypertension.  He was first diagnosed with HTN in 1970s.  He was previously controlled prior to the COVID-19 pandemic.  He was noted to not be as active due to not being able to visit the gym.  He had been started on terazosin after his blood pressure was in the 150s over 90s at [facility].  His terazosin was later discontinued because he had not seen any improvement with his blood pressure.  He was started on spironolactone.  He tolerated medication well.(!) 149/91His blood pressure was much better controlled and was mostly in the 130s to 140s over 80s.  He had started exercising again as well.  He indicated that he was using a stationary recumbent bike 3-4 times per week for 30 minutes at a time.  This produced no exertional chest pain or shortness of breath.  He was noted to have mild lower extremity edema bilaterally the day.  This responded well to elevation and lower extremity support stockings.  When he was last seen by Dr. Oval Linsey on 04/27/2019 he denied orthopnea and PND.  He indicated  that his wife did most of the cooking and she was limiting her salt intake as well.  He was continuing to take care of his grandchildren at that time.  He presented to the clinic 06/26/19 for further evaluation and stated he fely well.  He had been monitoring his blood pressure at home and has noticed that it runs in the mid 130s over 80s.  He was increasing his physical activity and tried to stay away from salt in his diet.  He continued to wear his lower extremity support stockings.  He was concerned about side effects of doxazosin.  They were reviewed and the medication was reviewed with hospital pharmacist.  Started doxazosin, salty 6 given, follow-up in 1 month.  Continue blood pressure log.  He presents to the clinic today for follow-up evaluation and states he feels well.  He has been monitoring his blood pressures at home and has noted that his blood pressures are ranging from high 120s to mid 130s over 80s with the addition of doxazosin.  He continues to be physically active and is wearing his bilateral knee braces today.  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  He is eating a low-sodium diet.  I will increase his doxazosin to 2 mg daily, have him continue his physical activity, and follow-up in 1 month.  Today he denies chest pain, shortness of breath,  lower extremity edema, fatigue, palpitations, melena, hematuria, hemoptysis, diaphoresis, weakness, presyncope, syncope, orthopnea, and PND.  Home Medications    Prior to Admission medications   Medication Sig Start Date End Date Taking? Authorizing Provider  amLODipine (NORVASC) 10 MG tablet Take 10 mg by mouth daily.    [provider]  ascorbic acid (VITAMIN C) 500 MG/5ML syrup Take by mouth daily.    [provider]  aspirin EC 81 MG tablet Take 81 mg by mouth daily.    [provider]  atorvastatin (LIPITOR) 10 MG tablet Take 10 mg by mouth daily.     [provider]  doxazosin (CARDURA) 1 MG tablet Take 1 tablet (1 mg total) by mouth daily. 06/26/19   Deberah Pelton, NP  ibuprofen (ADVIL,MOTRIN) 800 MG tablet Take 800 mg by mouth every 6 (six) hours as needed for headache or moderate pain.  05/07/15   [provider]  Multiple Vitamin (MULTIVITAMIN ADULT PO) Take by mouth.    [provider]  REFRESH 1.4-0.6 % ophthalmic solution Place 1-2 drops into both eyes at bedtime as needed (dry eyes).  02/24/16   [provider]  spironolactone (ALDACTONE) 25 MG tablet Take 1 tablet (25 mg total) by mouth daily. 03/27/19 06/25/19  [name], MD  valsartan-hydrochlorothiazide (DIOVAN-HCT) 320-12.5 MG per tablet Take 1 tablet by mouth daily.    [provider]  vitamin E 200 UNIT capsule Take 200 Units by mouth daily.    [provider]  XALATAN 0.005 % ophthalmic solution Place 1 drop into both eyes at bedtime.  05/07/15   [provider]    Family History    Family History  Problem Relation Age of Onset  . CAD Father   . Bradycardia Sister    He indicated that his mother is alive. He indicated that his father is deceased. He indicated that the status of his sister is unknown. He indicated that his brother is deceased.  Social History    Social History   Socioeconomic History  . Marital status: Married    Spouse name: Not on file  . Number of children: Not on file  . Years of education: Not on file  . Highest education level: Not on file  Occupational History  . Not on file  Tobacco Use  . Smoking status: Never Smoker  . Smokeless tobacco: Never Used  Substance and Sexual Activity  . Alcohol use: Yes    Alcohol/week: 1.0 standard drink    Types: 1 Glasses of wine per week  . Drug use: Not on file  . Sexual activity: Not on file  Other Topics Concern  . Not on file  Social History Narrative  . Not on file   Social Determinants of Health   Financial Resource  Strain:   . Difficulty of Paying Living Expenses:   Food Insecurity:   . Worried About Charity fundraiser in the Last Year:   . Arboriculturist in the Last Year:   Transportation Needs:   . Film/video editor (Medical):   Marland Kitchen Lack of Transportation (Non-Medical):   Physical Activity:   . Days of Exercise per Week:   . Minutes of Exercise per Session:   Stress:   . Feeling of Stress :   Social Connections:   . Frequency of Communication with Friends and Family:   . Frequency of Social Gatherings with Friends and Family:   . Attends Religious Services:   . Active  Member of Clubs or Organizations:   . Attends Archivist Meetings:   Marland Kitchen Marital Status:   Intimate Partner Violence:   . Fear of Current or Ex-Partner:   . Emotionally Abused:   Marland Kitchen Physically Abused:   . Sexually Abused:      Review of Systems    General:  No chills, fever, night sweats or weight changes.  Cardiovascular:  No chest pain, dyspnea on exertion, edema, orthopnea, palpitations, paroxysmal nocturnal dyspnea. Dermatological: No rash, lesions/masses Respiratory: No cough, dyspnea Urologic: No hematuria, dysuria Abdominal:   No nausea, vomiting, diarrhea, bright red blood per rectum, melena, or hematemesis Neurologic:  No visual changes, wkns, changes in mental status. All other systems reviewed and are otherwise negative except as noted above.  Physical Exam    VS:  BP 136/82 (BP Location: Right Arm, Patient Position: Sitting, Cuff Size: Normal)   Pulse 93   Ht 6\' 5"  (1.956 m)   Wt 267 lb 3.2 oz (121.2 kg)   SpO2 96%   BMI 31.69 kg/m  , BMI Body mass index is 31.69 kg/m. GEN: Well nourished, well developed, in no acute distress. HEENT: normal. Neck: Supple, no JVD, carotid bruits, or masses. Cardiac: RRR, no murmurs, rubs, or gallops. No clubbing, cyanosis, edema.  Radials/DP/PT 2+ and equal bilaterally.  Respiratory:  Respirations regular and unlabored, clear to auscultation bilaterally.  GI: Soft, nontender, nondistended, BS + x 4. MS: no deformity or atrophy. Skin: warm and dry, no rash. Neuro:  Strength and sensation are intact. Psych: Normal affect.  Accessory Clinical Findings    ECG personally reviewed by me today-none today.  EKG 04/27/2018 Sinus rhythm with first-degree AV block and PVCs 74 bpm  Assessment & Plan   1.  Essential hypertension-BP today 136/82.  Goal less than 130/80.  Previously encouraged to continue exercise and weight loss.  Continue amlodipine, spironolactone, valsartan, and HCTZ Increase doxazosin 2 mg at bedtime Heart healthy low-sodium diet-salty 6 given Increase physical activity as tolerated Continue B/p log   Pure hypercholesterolemia-well controlled. Continue atorvastatin Heart healthy low-sodium high-fiber diet Increase physical activity as tolerated  Disposition: Follow-up with Dr. Oval Linsey or me in 1 months.  Jossie Ng. Deshante Cassell NP-C    08/08/2019, 12:05 PM [address] Office [phone] Fax [phone]

## 2019-08-08 ENCOUNTER — Other Ambulatory Visit: Payer: Self-pay

## 2019-08-08 ENCOUNTER — Ambulatory Visit (INDEPENDENT_AMBULATORY_CARE_PROVIDER_SITE_OTHER): Payer: Medicare Other | Admitting: General Practice

## 2019-08-08 ENCOUNTER — Encounter: Payer: Self-pay | Admitting: General Practice

## 2019-08-08 VITALS — BP 136/82 | HR 93 | Ht 77.0 in | Wt 267.2 lb

## 2019-08-08 DIAGNOSIS — E7800 Pure hypercholesterolemia, unspecified: Secondary | ICD-10-CM | POA: Diagnosis not present

## 2019-08-08 DIAGNOSIS — I10 Essential (primary) hypertension: Secondary | ICD-10-CM

## 2019-08-08 MED ORDER — DOXAZOSIN MESYLATE 2 MG PO TABS: 1.0000 mg | ORAL_TABLET | Freq: Every day | ORAL | 3 refills | Status: DC

## 2019-08-08 MED ORDER — DOXAZOSIN MESYLATE 2 MG PO TABS: 2.0000 mg | ORAL_TABLET | Freq: Every day | ORAL | 0 refills | Status: DC

## 2019-08-08 NOTE — Addendum Note (Signed)
Addended by: Waylan Rocher on: 08/08/2019 12:37 PM   Modules accepted: Orders

## 2019-08-08 NOTE — Patient Instructions (Addendum)
Medication Instructions:  INCREASE DOXAZOSIN 2MG  DAILY-MAY TAKE 2 OF YOUR CURRENT MEDICATION UNTIL GONE *If you need a refill on your cardiac medications before your next appointment, please call your pharmacy*  Special Instructions CONTINUE TO TAKE AND LOG YOU BLOOD PRESSURE DAILY  CONTINUE PHYSICAL ACTIVITY  PLEASE READ AND FOLLOW SALTY 6-ATTACHED  Follow-Up: Your next appointment:  1 month(s)  In Person with You may see [name], MD, Coletta Memos, FNP or one of the following Advanced Practice Providers on your designated Care Team:  Kerin Ransom, [place], [place]  At [facility], you and your health needs are our priority.  As part of our continuing mission to provide you with exceptional heart care, we have created designated Provider Care Teams.  These Care Teams include your primary Cardiologist (physician) and Advanced Practice Providers (APPs -  Physician Assistants and Nurse Practitioners) who all work together to provide you with the care you need, when you need it.

## 2019-09-10 NOTE — Progress Notes (Signed)
Cardiology Clinic Note   Patient Name: Douglas Proctor Date of Encounter: 09/11/2019  Primary Care Provider:  [facility] Cardiologist:  [name], MD  Patient Profile    [name]47 year old male presents the clinic today for follow-up evaluation of his hypertension.   Past Medical History    Past Medical History:  Diagnosis Date  . Essential hypertension 04/27/2019  . Pure hypercholesterolemia 04/27/2019   History reviewed. No pertinent surgical history.  Allergies  Allergies  Allergen Reactions  . Lisinopril Swelling    Lips and face swelling  . Other     Cats and dogs dander   . Oysters [Shellfish Allergy] Other (See Comments)    Unknown exact reaction- was tested    History of Present Illness    Douglas Proctor has a PMH of HTN and HLD. He was initially seen by Dr. Oval Linsey on 2/21for evaluation and management of his hypertension. He was first diagnosed with HTN in 1970s. He was previously controlled prior to the COVID-19 pandemic. He was noted to not be as active due to not being able to visit the gym. He had been started on terazosin after his blood pressure was in the 150s over 90s at [facility]. His terazosin was later discontinued because he had not seen any improvement with his blood pressure. He was started on spironolactone. He tolerated medication well.(!) 149/91His blood pressure was much better controlled and was mostly in the 130s to 140s over 80s. He had started exercising again as well. He indicated that he was using a stationary recumbent bike 3-4 times per week for 30 minutes at a time. This produced no exertional chest pain or shortness of breath. He was noted to have mild lower extremity edema bilaterally the day. This responded well to elevation and lower extremity support stockings. When he was last seen by Dr. Oval Linsey on 04/27/2019 he denied orthopnea and PND. He indicated that his wife did  most of the cooking and she was limiting her salt intake as well. He was continuing to take care of his grandchildren at that time.  He presented to the clinic 06/26/19 for further evaluation and statedhe fely well. He had been monitoring his blood pressure at home and has noticed that it runs in the mid 130s over 80s. He was increasing his physical activity and tried to stay away from salt in his diet. He continued to wear his lower extremity support stockings. He was concerned about side effects of doxazosin. They were reviewed and the medication was reviewed with hospital pharmacist. Started doxazosin, salty 6 given, follow-up in 1 month. Continue blood pressure log.  He presented to the clinic 08/08/2019 for follow-up evaluation and stated he felt well.  He had been monitoring his blood pressures at home and had noted that his blood pressures were ranging from high 120s to mid 130s over 80s with the addition of doxazosin.  He continued to be physically active and was wearing his bilateral knee braces.  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  He was eating a low-sodium diet.  I  increased his doxazosin to 2 mg daily, had him continue his physical activity, and planned follow-up for 1 month.  He presents to the clinic today for follow-up evaluation and states he feels well.  He has tolerated his doxazosin increase well without side effects.  His blood pressures have been well controlled at home 120s  over 55s.  I will continue his current medication and have him follow-up with Dr. Oval Linsey in 3 months.  He will continue his blood pressure log and continue his low-sodium diet.  Today hedenies chest pain, shortness of breath, lower extremity edema, fatigue, palpitations, melena, hematuria, hemoptysis, diaphoresis, weakness, presyncope, syncope, orthopnea, and PND.  Home Medications    Prior to Admission medications   Medication Sig  Start Date End Date Taking? Authorizing Provider  amLODipine (NORVASC) 10 MG tablet Take 10 mg by mouth daily.    [provider]  ascorbic acid (VITAMIN C) 500 MG/5ML syrup Take by mouth daily.    [provider]  aspirin EC 81 MG tablet Take 81 mg by mouth daily.    [provider]  atorvastatin (LIPITOR) 10 MG tablet Take 10 mg by mouth daily.    [provider]  doxazosin (CARDURA) 2 MG tablet Take 1 tablet (2 mg total) by mouth daily. 08/08/19   Deberah Pelton, NP  ibuprofen (ADVIL,MOTRIN) 800 MG tablet Take 800 mg by mouth every 6 (six) hours as needed for headache or moderate pain.  05/07/15   [provider]  Multiple Vitamin (MULTIVITAMIN ADULT PO) Take by mouth.    [provider]  REFRESH 1.4-0.6 % ophthalmic solution Place 1-2 drops into both eyes at bedtime as needed (dry eyes).  02/24/16   [provider]  spironolactone (ALDACTONE) 25 MG tablet Take 1 tablet (25 mg total) by mouth daily. 03/27/19 06/25/19  [name], MD  valsartan-hydrochlorothiazide (DIOVAN-HCT) 320-12.5 MG per tablet Take 1 tablet by mouth daily.    [provider]  vitamin E 200 UNIT capsule Take 200 Units by mouth daily.    [provider]  XALATAN 0.005 % ophthalmic solution Place 1 drop into both eyes at bedtime.  05/07/15   [provider]    Family History    Family History  Problem Relation Age of Onset  . CAD Father   . Bradycardia Sister    He indicated that his mother is alive. He indicated that his father is deceased. He indicated that the status of his sister is unknown. He indicated that his brother is deceased.  Social History    Social History   Socioeconomic History  . Marital status: Married    Spouse name: Not on file  . Number of children: Not on file  . Years of education: Not on file  . Highest education level: Not on file  Occupational History  . Not on file  Tobacco Use  . Smoking  status: Never Smoker  . Smokeless tobacco: Never Used  Substance and Sexual Activity  . Alcohol use: Yes    Alcohol/week: 1.0 standard drink    Types: 1 Glasses of wine per week  . Drug use: Not on file  . Sexual activity: Not on file  Other Topics Concern  . Not on file  Social History Narrative  . Not on file   Social Determinants of Health   Financial Resource Strain:   . Difficulty of Paying Living Expenses:   Food Insecurity:   . Worried About Charity fundraiser in the Last Year:   . Arboriculturist in the Last Year:   Transportation Needs:   . Film/video editor (Medical):   Marland Kitchen Lack of Transportation (Non-Medical):   Physical Activity:   . Days of Exercise per Week:   . Minutes of Exercise per Session:   Stress:   .  Feeling of Stress :   Social Connections:   . Frequency of Communication with Friends and Family:   . Frequency of Social Gatherings with Friends and Family:   . Attends Religious Services:   . Active Member of Clubs or Organizations:   . Attends Archivist Meetings:   Marland Kitchen Marital Status:   Intimate Partner Violence:   . Fear of Current or Ex-Partner:   . Emotionally Abused:   Marland Kitchen Physically Abused:   . Sexually Abused:      Review of Systems    General:  No chills, fever, night sweats or weight changes.  Cardiovascular:  No chest pain, dyspnea on exertion, edema, orthopnea, palpitations, paroxysmal nocturnal dyspnea. Dermatological: No rash, lesions/masses Respiratory: No cough, dyspnea Urologic: No hematuria, dysuria Abdominal:   No nausea, vomiting, diarrhea, bright red blood per rectum, melena, or hematemesis Neurologic:  No visual changes, wkns, changes in mental status. All other systems reviewed and are otherwise negative except as noted above.  Physical Exam    VS:  BP (!) 150/86   Pulse 93   Ht 6\' 5"  (1.956 m)   Wt (!) 260 lb (117.9 kg)   SpO2 97%   BMI 30.83 kg/m  , BMI Body mass index is 30.83 kg/m. GEN: Well  nourished, well developed, in no acute distress. HEENT: normal. Neck: Supple, no JVD, carotid bruits, or masses. Cardiac: RRR, no murmurs, rubs, or gallops. No clubbing, cyanosis, edema.  Radials/DP/PT 2+ and equal bilaterally.  Respiratory:  Respirations regular and unlabored, clear to auscultation bilaterally. GI: Soft, nontender, nondistended, BS + x 4. MS: no deformity or atrophy. Skin: warm and dry, no rash. Neuro:  Strength and sensation are intact. Psych: Normal affect.  Accessory Clinical Findings    Recent Labs: 04/27/2019: BUN 14; Creatinine, Ser 1.06; Potassium 4.3; Sodium 141   Recent Lipid Panel No results found for: CHOL, TRIG, HDL, CHOLHDL, VLDL, LDLCALC, LDLDIRECT  ECG personally reviewed by me today-none today.  EKG 04/27/2018 Sinus rhythm with first-degree AV block and PVCs 74 bpm  Assessment & Plan   1.  Essential hypertension-BP [id number].Goal less than 130/80.  Well-controlled at home.  120s over 70s. Previously encouraged to continue exercise and weight loss.  Continue amlodipine, spironolactone, valsartan, and HCTZ Continue doxazosin 2 mg at bedtime Heart healthy low-sodium diet-salty 6 given Increase physical activity as tolerated ContinueB/p log   Pure hypercholesterolemia-well controlled. Continue atorvastatin Heart healthy low-sodium high-fiber diet Increase physical activity as tolerated  Disposition: Follow-up with Dr. Oval Linsey or me in 3 months.   Jossie Ng. Ariela Mochizuki NP-C    09/11/2019, 10:38 AM [address] Office [phone] Fax [phone]

## 2019-09-11 ENCOUNTER — Other Ambulatory Visit: Payer: Self-pay

## 2019-09-11 ENCOUNTER — Encounter: Payer: Self-pay | Admitting: General Practice

## 2019-09-11 ENCOUNTER — Ambulatory Visit (INDEPENDENT_AMBULATORY_CARE_PROVIDER_SITE_OTHER): Payer: Medicare Other | Admitting: General Practice

## 2019-09-11 VITALS — BP 150/86 | HR 93 | Ht 77.0 in | Wt 260.0 lb

## 2019-09-11 DIAGNOSIS — E7800 Pure hypercholesterolemia, unspecified: Secondary | ICD-10-CM | POA: Diagnosis not present

## 2019-09-11 DIAGNOSIS — I10 Essential (primary) hypertension: Secondary | ICD-10-CM

## 2019-09-11 DIAGNOSIS — Z5181 Encounter for therapeutic drug level monitoring: Secondary | ICD-10-CM | POA: Diagnosis not present

## 2019-09-11 MED ORDER — DOXAZOSIN MESYLATE 2 MG PO TABS: 2.0000 mg | ORAL_TABLET | Freq: Every day | ORAL | 0 refills | Status: DC

## 2019-09-11 NOTE — Patient Instructions (Signed)
Medication Instructions:  Your physician recommends that you continue on your current medications as directed. Please refer to the Current Medication list given to you today.  *If you need a refill on your cardiac medications before your next appointment, please call your pharmacy*   Follow-Up: At [facility], you and your health needs are our priority.  As part of our continuing mission to provide you with exceptional heart care, we have created designated Provider Care Teams.  These Care Teams include your primary Cardiologist (physician) and Advanced Practice Providers (APPs -  Physician Assistants and Nurse Practitioners) who all work together to provide you with the care you need, when you need it.  We recommend signing up for the patient portal called "MyChart".  Sign up information is provided on this After Visit Summary.  MyChart is used to connect with patients for Virtual Visits (Telemedicine).  Patients are able to view lab/test results, encounter notes, upcoming appointments, etc.  Non-urgent messages can be sent to your provider as well.   To learn more about what you can do with MyChart, go to [email or web address].    Your next appointment:   3 month(s)  The format for your next appointment:   In Person  Provider:   [name], MD

## 2019-09-11 NOTE — Addendum Note (Signed)
Addended by: Therisa Doyne on: 09/11/2019 10:48 AM   Modules accepted: Orders

## 2019-11-02 ENCOUNTER — Ambulatory Visit (INDEPENDENT_AMBULATORY_CARE_PROVIDER_SITE_OTHER): Payer: Medicare Other | Admitting: Pharmacist

## 2019-11-02 ENCOUNTER — Other Ambulatory Visit: Payer: Self-pay

## 2019-11-02 VITALS — BP 140/86 | HR 97 | Ht 77.0 in

## 2019-11-02 DIAGNOSIS — I10 Essential (primary) hypertension: Secondary | ICD-10-CM | POA: Diagnosis not present

## 2019-11-02 MED ORDER — CARVEDILOL 6.25 MG PO TABS: 6.2500 mg | ORAL_TABLET | Freq: Two times a day (BID) | ORAL | 1 refills | Status: DC

## 2019-11-02 NOTE — Progress Notes (Signed)
Patient ID: Douglas Proctor                 DOB: 01/20/1941                      MRN: [id number]     HPI: Douglas Proctor is a 79 y.o. male referred by Dr. Oval Linsey to HTN clinic. PMH includes hypertension, pre-diabetes, hyperlipidemia, and low back pain.  Patient started to experienced increase urine urgency after increasing doxazosin from 1mg  to 2mg . Patient main complain is "stange feeling" immediatly followed by urinary urgency. Also complain of constipation and is currently taking docusate sodium every other day with iron supplement.  Current HTN meds:  Amlodipine 10mg  daily Valsartan 320mg  daily HCTZ 25mg  daily  Spironolactone 25mg  daily Doxazosin 2mg  daily   Previously tried:  Lisinopril - angioedema  BP goal: 130/80  Family History: The patient's family history includes Bradycardia in his sister; CAD in his father.   Social History: 1 glass of wine per week, denies tobacco use  Diet: mostly home cooked meals with limited sodium inatek  Exercise: stationary recumbent bike 3-4 times per week for 30 minutes at a time.  Home BP readings: no records provided  Wt Readings from Last 3 Encounters:  09/11/19 (!) 260 lb (117.9 kg)  08/08/19 267 lb 3.2 oz (121.2 kg)  06/26/19 264 lb (119.7 kg)   BP Readings from Last 3 Encounters:  11/02/19 140/86  09/11/19 (!) 150/86  08/08/19 136/82   Pulse Readings from Last 3 Encounters:  11/02/19 97  09/11/19 93  08/08/19 93    Past Medical History:  Diagnosis Date  . Essential hypertension 04/27/2019  . Pure hypercholesterolemia 04/27/2019    Current Outpatient Medications on File Prior to Visit  Medication Sig Dispense Refill  . amLODipine (NORVASC) 10 MG tablet Take 10 mg by mouth daily.    Marland Kitchen ascorbic acid (VITAMIN C) 500 MG/5ML syrup Take by mouth daily.    Marland Kitchen aspirin EC 81 MG tablet Take 81 mg by mouth daily.    Marland Kitchen atorvastatin (LIPITOR) 10 MG tablet Take 10 mg by mouth daily.    . Cholecalciferol 25 MCG (1000 UT) tablet Take  2,000 Units by mouth.     . docusate sodium (COLACE) 100 MG capsule Take 100 mg by mouth daily as needed for mild constipation.    . Ferrous Sulfate (IRON) 325 (65 Fe) MG TABS Take 1 tablet by mouth every other day.    . hydrochlorothiazide (HYDRODIURIL) 25 MG tablet Take 25 mg by mouth daily.    Marland Kitchen ibuprofen (ADVIL,MOTRIN) 800 MG tablet Take 800 mg by mouth every 6 (six) hours as needed for headache or moderate pain.     . Multiple Vitamin (MULTIVITAMIN ADULT PO) Take by mouth.    . sildenafil (VIAGRA) 50 MG tablet Take 1/2-1 tab prior to sexual activity    . spironolactone (ALDACTONE) 25 MG tablet Take 1 tablet (25 mg total) by mouth daily. 90 tablet 3  . valsartan (DIOVAN) 320 MG tablet Take 320 mg by mouth daily.    Marland Kitchen XALATAN 0.005 % ophthalmic solution Place 1 drop into both eyes at bedtime.      No current facility-administered medications on file prior to visit.    Allergies  Allergen Reactions  . Lisinopril Swelling    Lips and face swelling  . Other     Cats and dogs dander   . Oysters [Shellfish Allergy] Other (See Comments)  Unknown exact reaction- was tested    Blood pressure 140/86, pulse 97, height 6\' 5"  (1.956 m), SpO2 98 %.  Essential hypertension Blood pressure remains above goal but patient is experiencing urinary urgency since doxazosin dose was increased from 1mg  to 2mg  daily.  PCP checked u/a and culture for possible UTI and both came back negative. Patient has multiple factor for increased urinary urgency including, increased alpha1-blocker, diuretics (HCTZ and spironolactone), and constipation.  Will stop doxazosin and bactrim therapy. Start carvedilol 6.25mg  twice daily to improve BP and HR. Docusate sodium 100mg  increased to daily use for 2 weeks, and iron supplement HELD for 1 week.  Patient is already scheduled for BP follow up with DR Oval Linsey in 5 weeks, but was encouraged to call back before appointment if symptoms not improved or worsen.   Douglas Mousel  Proctor PharmD, BCPS, [place] [address] [place] [address] 11/05/2019 10:30 AM

## 2019-11-02 NOTE — Patient Instructions (Addendum)
Return for a  follow up appointment in 5 weeks  Check your blood pressure at home daily (if able) and keep record of the readings.  Take your BP meds as follows: *STOP taking antibiotic (sulfameth/Trimethopr) *STOP taking doxazosin 2mg * *START taking carvedilol 6.25mg  twice daily* *HOLD iron pill for 1 week, then start taking 1 tablet every Monday, Wednesday and Friday ONLY *Take stool softener every day for 2 week, then daily as needed for constipation   Bring all of your meds, your BP cuff and your record of home blood pressures to your next appointment.  Exercise as you're able, try to walk approximately 30 minutes per day.  Keep salt intake to a minimum, especially watch canned and prepared boxed foods.  Eat more fresh fruits and vegetables and fewer canned items.  Avoid eating in fast food restaurants.    HOW TO TAKE YOUR BLOOD PRESSURE: . Rest 5 minutes before taking your blood pressure. .  Don't smoke or drink caffeinated beverages for at least 30 minutes before. . Take your blood pressure before (not after) you eat. . Sit comfortably with your back supported and both feet on the floor (don't cross your legs). . Elevate your arm to heart level on a table or a desk. . Use the proper sized cuff. It should fit smoothly and snugly around your bare upper arm. There should be enough room to slip a fingertip under the cuff. The bottom edge of the cuff should be 1 inch above the crease of the elbow. . Ideally, take 3 measurements at one sitting and record the average.

## 2019-11-05 ENCOUNTER — Encounter: Payer: Self-pay | Admitting: Pharmacist

## 2019-11-05 NOTE — Assessment & Plan Note (Signed)
Blood pressure remains above goal but patient is experiencing urinary urgency since doxazosin dose was increased from 1mg  to 2mg  daily.  PCP checked u/a and culture for possible UTI and both came back negative. Patient has multiple factor for increased urinary urgency including, increased alpha1-blocker, diuretics (HCTZ and spironolactone), and constipation.  Will stop doxazosin and bactrim therapy. Start carvedilol 6.25mg  twice daily to improve BP and HR. Docusate sodium 100mg  increased to daily use for 2 weeks, and iron supplement HELD for 1 week.  Patient is already scheduled for BP follow up with DR Oval Linsey in 5 weeks, but was encouraged to call back before appointment if symptoms not improved or worsen.

## 2019-11-14 ENCOUNTER — Telehealth: Payer: Self-pay | Admitting: *Deleted

## 2019-11-15 MED ORDER — CARVEDILOL 6.25 MG PO TABS: 6.2500 mg | ORAL_TABLET | Freq: Two times a day (BID) | ORAL | 5 refills | Status: DC

## 2019-11-15 NOTE — Telephone Encounter (Signed)
Refilled Carvedilol as requested

## 2019-12-11 ENCOUNTER — Encounter: Payer: Self-pay | Admitting: Cardiovascular Disease

## 2019-12-11 ENCOUNTER — Ambulatory Visit (INDEPENDENT_AMBULATORY_CARE_PROVIDER_SITE_OTHER): Payer: Medicare Other | Admitting: Cardiovascular Disease

## 2019-12-11 ENCOUNTER — Other Ambulatory Visit: Payer: Self-pay

## 2019-12-11 VITALS — BP 156/94 | HR 79 | Ht 77.0 in | Wt 257.6 lb

## 2019-12-11 DIAGNOSIS — I10 Essential (primary) hypertension: Secondary | ICD-10-CM

## 2019-12-11 DIAGNOSIS — R011 Cardiac murmur, unspecified: Secondary | ICD-10-CM

## 2019-12-11 DIAGNOSIS — E7800 Pure hypercholesterolemia, unspecified: Secondary | ICD-10-CM

## 2019-12-11 HISTORY — DX: Cardiac murmur, unspecified: R01.1

## 2019-12-11 MED ORDER — HYDRALAZINE HCL 50 MG PO TABS: 50.0000 mg | ORAL_TABLET | Freq: Two times a day (BID) | ORAL | 3 refills | Status: DC

## 2019-12-11 MED ORDER — OLMESARTAN-AMLODIPINE-HCTZ 40-10-25 MG PO TABS: 1.0000 | ORAL_TABLET | Freq: Every day | ORAL | 3 refills | Status: DC

## 2019-12-11 NOTE — Patient Instructions (Addendum)
Medication Instructions:  STOP CARVEDILOL   START HYDRALAZINE 50 MG TWICE A DAY  STOP ASPIRIN   STOP AMLODIPINE, HCTZ, AND VALSARTAN  START OLMESARTAN-AMLOPDINE-HCTZ 40-10-25 MG COMBO DAILY   *If you need a refill on your cardiac medications before your next appointment, please call your pharmacy*  Lab Work: TSH TODAY   If you have labs (blood work) drawn today and your tests are completely normal, you will receive your results only by: Marland Kitchen MyChart Message (if you have MyChart) OR . A paper copy in the mail If you have any lab test that is abnormal or we need to change your treatment, we will call you to review the results.  Testing/Procedures: Your physician has requested that you have a renal artery duplex. During this test, an ultrasound is used to evaluate blood flow to the kidneys. Allow one hour for this exam. Do not eat after midnight the day before and avoid carbonated beverages. Take your medications as you usually do.  Follow-Up: At [facility], you and your health needs are our priority.  As part of our continuing mission to provide you with exceptional heart care, we have created designated Provider Care Teams.  These Care Teams include your primary Cardiologist (physician) and Advanced Practice Providers (APPs -  Physician Assistants and Nurse Practitioners) who all work together to provide you with the care you need, when you need it.  We recommend signing up for the patient portal called "MyChart".  Sign up information is provided on this After Visit Summary.  MyChart is used to connect with patients for Virtual Visits (Telemedicine).  Patients are able to view lab/test results, encounter notes, upcoming appointments, etc.  Non-urgent messages can be sent to your provider as well.   To learn more about what you can do with MyChart, go to [email or web address].    Your next appointment:   4 week(s)  The format for your next appointment:   In Person OR VIRTUAL    Provider:   You may see [name], MD or one of the following Advanced Practice Providers on your designated Care Team:    Kerin Ransom, PA-C  [place], [place]  Coletta Memos, [place]    Other Instructions  CONTINUE TO [place]. HAVE READINGS AT YOUR NEXT VISIT

## 2019-12-11 NOTE — Progress Notes (Signed)
Cardiology Office Note   Date:  12/11/2019   ID:  Douglas Proctor, DOB June 03, 1940, MRN [id number]  PCP:  [facility]  Cardiologist:   [name], MD   No chief complaint on file.    History of Present Illness: Douglas Proctor is a 79 y.o. male with hypertension and hyperlipidemia here for follow-up.  He was initially seen 03/2019 for management of hypertension. He was first diagnosed with hypertension in the 1970s.  It was previously well-controlled prior to the pandemic.  However lately he has not been able to go to the gym.  Prior to that he was started on Terazosin after his blood pressure was in the 150s over 90s at [facility].  Terazosin was discontinued as he had not seen any improvement in his blood pressure.  He was started on spironolactone instead.  He has tolerated this well.  His blood pressures have been mostly in the 130s to 140s over 80s.  He has started back exercising.  He uses the recumbent bike at least 3 to 4 days/week.  He rides it for 30 minutes and has no exertional chest pain or shortness of breath.  He has some mild lower extremity edema by the end of the day that improves with elevation and compression stockings.  He has no orthopnea or PND.  His wife does most of the cooking and she limits their salt intake.  He continues to help take care of his grandchildren and enjoys doing this.  At his last appointment Mr. Huish blood pressure was slightly above goal.  He wanted to keep working on diet and exercise before making any changes.   He followed up with Coletta Memos on 06/2019 and his blood pressure is nearly at goal.  He was started on doxazosin 1 mg nightly and subsequently increased to 2 mg.  He developed urinary symptoms and doxazosin was discontinued.  He was started on carvedilol instead.  Since then his HR has been reduced but his blood pressure remains high.  He brings a log of his pressure showing that it is ranged from the  120s to the 160s.  On average is about 130s.  He is happy that the urination issues have improved.  He drinks 1 coffee daily but does like to drink Gatorade's.  He rarely uses NSAIDs.  He has no over-the-counter supplements other than a multivitamin and vitamin E.  He denies snoring and feels rested when he wakes up in the morning.  He continues to exercise regularly and has no exertional symptoms.   Past Medical History:  Diagnosis Date  . Essential hypertension 04/27/2019  . Murmur 12/11/2019  . Pure hypercholesterolemia 04/27/2019    History reviewed. No pertinent surgical history.   Current Outpatient Medications  Medication Sig Dispense Refill  . ascorbic acid (VITAMIN C) 500 MG/5ML syrup Take by mouth daily.    Marland Kitchen atorvastatin (LIPITOR) 10 MG tablet Take 10 mg by mouth daily.    . Cholecalciferol 25 MCG (1000 UT) tablet Take 2,000 Units by mouth.     . docusate sodium (COLACE) 100 MG capsule Take 100 mg by mouth daily as needed for mild constipation.    Marland Kitchen doxazosin (CARDURA) 1 MG tablet 2 mg.    . Ferrous Sulfate (IRON) 325 (65 Fe) MG TABS Take 1 tablet by mouth every other day.    . ibuprofen (ADVIL,MOTRIN) 800 MG tablet Take 800 mg by mouth every 6 (six) hours as needed for headache  or moderate pain.     . Multiple Vitamin (MULTIVITAMIN ADULT PO) Take by mouth.    . sildenafil (VIAGRA) 50 MG tablet Take 1/2-1 tab prior to sexual activity    . spironolactone (ALDACTONE) 25 MG tablet Take 1 tablet (25 mg total) by mouth daily. 90 tablet 3  . XALATAN 0.005 % ophthalmic solution Place 1 drop into both eyes at bedtime.     . hydrALAZINE (APRESOLINE) 50 MG tablet Take 1 tablet (50 mg total) by mouth in the morning and at bedtime. 180 tablet 3  . Olmesartan-amLODIPine-HCTZ 40-10-25 MG TABS Take 1 tablet by mouth daily. 90 tablet 3   No current facility-administered medications for this visit.    Allergies:   Lisinopril, Other, and Oysters [shellfish allergy]    Social History:  The  patient  reports that he has never smoked. He has never used smokeless tobacco. He reports current alcohol use of about 1.0 standard drink of alcohol per week.   Family History:  The patient's family history includes Bradycardia in his sister; CAD in his father.    ROS:  Please see the history of present illness.   Otherwise, review of systems are positive for none.   All other systems are reviewed and negative.    PHYSICAL EXAM: VS:  BP (!) 156/94   Pulse 79   Ht 6\' 5"  (1.956 m)   Wt 257 lb 9.6 oz (116.8 kg)   SpO2 95%   BMI 30.55 kg/m  , BMI Body mass index is 30.55 kg/m. GENERAL:  Well appearing HEENT:  Pupils equal round and reactive, fundi not visualized, oral mucosa unremarkable NECK:  No jugular venous distention, waveform within normal limits, carotid upstroke brisk and symmetric, no bruits LUNGS:  Clear to auscultation bilaterally HEART:  RRR.  PMI not displaced or sustained,S1 and S2 within normal limits, no S3, no S4, no clicks, no rubs, no murmurs ABD:  Flat, positive bowel sounds normal in frequency in pitch, no bruits, no rebound, no guarding, no midline pulsatile mass, no hepatomegaly, no splenomegaly EXT:  2 plus pulses throughout, no edema, no cyanosis no clubbing SKIN:  No rashes no nodules NEURO:  Cranial nerves II through XII grossly intact, motor grossly intact throughout PSYCH:  Cognitively intact, oriented to person place and time   EKG:  EKG is not ordered today. 04/27/2019: Sinus rhythm.  Rate 74 bpm.  First-degree AV block.  PVCs.  Recent Labs: 04/27/2019: BUN 14; Creatinine, Ser 1.06; Potassium 4.3; Sodium 141    Lipid Panel No results found for: CHOL, TRIG, HDL, CHOLHDL, VLDL, LDLCALC, LDLDIRECT    Wt Readings from Last 3 Encounters:  12/11/19 257 lb 9.6 oz (116.8 kg)  09/11/19 (!) 260 lb (117.9 kg)  08/08/19 267 lb 3.2 oz (121.2 kg)     ASSESSMENT AND PLAN:  # Essential hypertension:  Blood pressure is improving but still not at goal.  He  did not tolerate doxazosin and his blood pressure is not controlled on carvedilol.  It doesn't seem that increasing the dose will get him to goal based on where his pressures are currently.  We will stop the carvedilol and switch to hydralazine 50 mg twice daily.  In order to reduce his pill burden, we will switch the amlodipine, hydrochlorothiazide, and losartan to Tribenzor.  Continue spironolactone.  He will keep checking his blood pressure and bring to follow-up.   Secondary Causes of Hypertension  Medications/Herbal: OCP, steroids, stimulants, antidepressants, weight loss medication, immune suppressants, NSAIDs, sympathomimetics,  alcohol, caffeine, licorice, ginseng, St. John's wort, chemo  Sleep Apnea: (testing not indicated)  Renal artery stenosis: Check renal artery Doppler Hyperaldosteronism: will check when BP better controlled and can hold ARB/spironolactone Hyper/hypothyroidism:Check TSH Pheochromocytoma: (testing not indicated)  Cushing's syndrome: (testing not indicated)  Coarctation of the aorta: BP symmetric  # Pure hypercholesterolemia: Lipids are well-controlled on atorvastatin.  # CV Disease Prevention:  Continue atorvastatin.  Stop aspirin.  # Murmur:  Present since youth.  No heart failure on exam or symptom    Current medicines are reviewed at length with the patient today.  The patient does not have concerns regarding medicines.  The following changes have been made:  no change  Labs/ tests ordered today include:   Orders Placed This Encounter  Procedures  . TSH  . VAS US RENAL ARTERY DUPLEX     Disposition:   FU with Laneice Meneely C. Oval Linsey, MD, [facility] in 4-6 weeks     Signed, Hurley Sobel C. Oval Linsey, MD, [facility]  12/11/2019 4:27 PM    [place]

## 2019-12-12 LAB — TSH: TSH: 1.21 u[IU]/mL (ref 0.450–4.500)

## 2019-12-25 ENCOUNTER — Ambulatory Visit (INDEPENDENT_AMBULATORY_CARE_PROVIDER_SITE_OTHER): Payer: Medicare Other | Admitting: Cardiovascular Disease

## 2019-12-25 ENCOUNTER — Other Ambulatory Visit: Payer: Self-pay

## 2019-12-25 ENCOUNTER — Encounter: Payer: Self-pay | Admitting: Cardiovascular Disease

## 2019-12-25 VITALS — BP 150/78 | HR 92 | Temp 98.1°F | Ht 77.0 in | Wt 255.0 lb

## 2019-12-25 DIAGNOSIS — E7800 Pure hypercholesterolemia, unspecified: Secondary | ICD-10-CM | POA: Diagnosis not present

## 2019-12-25 DIAGNOSIS — R011 Cardiac murmur, unspecified: Secondary | ICD-10-CM | POA: Diagnosis not present

## 2019-12-25 DIAGNOSIS — I10 Essential (primary) hypertension: Secondary | ICD-10-CM

## 2019-12-25 MED ORDER — CLONIDINE HCL 0.1 MG PO TABS: 0.1000 mg | ORAL_TABLET | Freq: Two times a day (BID) | ORAL | 11 refills | Status: DC

## 2019-12-25 MED ORDER — CLONIDINE HCL 0.1 MG PO TABS: 0.1000 mg | ORAL_TABLET | Freq: Once | ORAL | 11 refills | Status: DC

## 2019-12-25 NOTE — Patient Instructions (Addendum)
Medication Instructions:  STOP HYDRALAZINE   START CLONIDINE 0.1 MG TWICE A DAY   *If you need a refill on your cardiac medications before your next appointment, please call your pharmacy*  Lab Work: CATACHOLAMINES/METANEPHRINES TODAY   If you have labs (blood work) drawn today and your tests are completely normal, you will receive your results only by: Marland Kitchen MyChart Message (if you have MyChart) OR . A paper copy in the mail If you have any lab test that is abnormal or we need to change your treatment, we will call you to review the results.  Testing/Procedures: NONE   Follow-Up: At [facility], you and your health needs are our priority.  As part of our continuing mission to provide you with exceptional heart care, we have created designated Provider Care Teams.  These Care Teams include your primary Cardiologist (physician) and Advanced Practice Providers (APPs -  Physician Assistants and Nurse Practitioners) who all work together to provide you with the care you need, when you need it.  We recommend signing up for the patient portal called "MyChart".  Sign up information is provided on this After Visit Summary.  MyChart is used to connect with patients for Virtual Visits (Telemedicine).  Patients are able to view lab/test results, encounter notes, upcoming appointments, etc.  Non-urgent messages can be sent to your provider as well.   To learn more about what you can do with MyChart, go to [email or web address].    Your next appointment:   KEEP FOLLOW UP WITH DR [facility] AS SCHEDULED 11/23 AT 1:40 PM

## 2019-12-25 NOTE — Progress Notes (Signed)
Cardiology Office Note   Date:  12/25/2019   ID:  Douglas Proctor, DOB Apr 10, 1940, MRN [id number]  PCP:  [facility]  Cardiologist:   [name], MD   No chief complaint on file.    History of Present Illness: Douglas Proctor is a 79 y.o. male with hypertension and hyperlipidemia here for follow-up.  He was initially seen 03/2019 for management of hypertension. He was first diagnosed with hypertension in the 1970s.  It was previously well-controlled prior to the pandemic.  However lately he has not been able to go to the gym.  Prior to that he was started on Terazosin after his blood pressure was in the 150s over 90s at [facility].  Terazosin was discontinued as he had not seen any improvement in his blood pressure.  He was started on spironolactone instead.  He has tolerated this well.  His blood pressures have been mostly in the 130s to 140s over 80s.  He has started back exercising.  He uses the recumbent bike at least 3 to 4 days/week.  He rides it for 30 minutes and has no exertional chest pain or shortness of breath.  He has some mild lower extremity edema by the end of the day that improves with elevation and compression stockings.  He has no orthopnea or PND.  His wife does most of the cooking and she limits their salt intake.  He continues to help take care of his grandchildren and enjoys doing this.  At his last appointment Mr. Jocson blood pressure was slightly above goal.  He wanted to keep working on diet and exercise before making any changes.   He followed up with Coletta Memos on 06/2019 and his blood pressure is nearly at goal.  He was started on doxazosin 1 mg nightly and subsequently increased to 2 mg.  His blood pressure was well-controlled but he developed urinary symptoms.  At his last appointment BP was not controlled and didn't change with the addition of carvedilol.  Carvedilol was stopped and he started hydralazine.  Since then his blood  pressure continues to be labile.  He notes that about 5 hours after taking his medication he starts to feel poorly.  He feels lightheaded and has a spiderweb-like sensation on the side of his face.  He also notes some intermittent pain under his left nipple.  The pain is random and does not occur with exertion.  There is no associated shortness of breath, lightheadedness, nausea or diaphoresis.  The episodes last for less than a minute and never when he is exercising.  He continues to have no edema and denies orthopnea or PND.  This morning he had some cough and mucus.  He has no fevers or chills and otherwise feels well.  He continues to exercise regularly and has no exertional symptoms.   Previous mediations: Carvedilol- ineffective doxazisin-urinary symptoms hydralazine  Past Medical History:  Diagnosis Date  . Essential hypertension 04/27/2019  . Murmur 12/11/2019  . Pure hypercholesterolemia 04/27/2019    History reviewed. No pertinent surgical history.   Current Outpatient Medications  Medication Sig Dispense Refill  . ascorbic acid (VITAMIN C) 500 MG/5ML syrup Take by mouth daily.    Marland Kitchen atorvastatin (LIPITOR) 10 MG tablet Take 10 mg by mouth daily.    . Cholecalciferol 25 MCG (1000 UT) tablet Take 2,000 Units by mouth.     . docusate sodium (COLACE) 100 MG capsule Take 100 mg by mouth daily as  needed for mild constipation.    . Ferrous Sulfate (IRON) 325 (65 Fe) MG TABS Take 1 tablet by mouth every other day.    . ibuprofen (ADVIL,MOTRIN) 800 MG tablet Take 800 mg by mouth every 6 (six) hours as needed for headache or moderate pain.     . Multiple Vitamin (MULTIVITAMIN ADULT PO) Take by mouth.    . Olmesartan-amLODIPine-HCTZ 40-10-25 MG TABS Take 1 tablet by mouth daily. 90 tablet 3  . sildenafil (VIAGRA) 50 MG tablet Take 1/2-1 tab prior to sexual activity    . XALATAN 0.005 % ophthalmic solution Place 1 drop into both eyes at bedtime.     . cloNIDine (CATAPRES) 0.1 MG tablet Take  1 tablet (0.1 mg total) by mouth 2 (two) times daily. 60 tablet 11  . spironolactone (ALDACTONE) 25 MG tablet Take 1 tablet (25 mg total) by mouth daily. 90 tablet 3   No current facility-administered medications for this visit.    Allergies:   Lisinopril, Other, and Oysters [shellfish allergy]    Social History:  The patient  reports that he has never smoked. He has never used smokeless tobacco. He reports current alcohol use of about 1.0 standard drink of alcohol per week.   Family History:  The patient's family history includes Bradycardia in his sister; CAD in his father.    ROS:  Please see the history of present illness.   Otherwise, review of systems are positive for none.   All other systems are reviewed and negative.    PHYSICAL EXAM: VS:  BP (!) 150/78   Pulse 92   Temp 98.1 F (36.7 C)   Ht 6\' 5"  (1.956 m)   Wt 255 lb (115.7 kg)   SpO2 95%   BMI 30.24 kg/m  , BMI Body mass index is 30.24 kg/m. GENERAL:  Well appearing HEENT:  Pupils equal round and reactive, fundi not visualized, oral mucosa unremarkable NECK:  No jugular venous distention, waveform within normal limits, carotid upstroke brisk and symmetric, no bruits LUNGS:  Clear to auscultation bilaterally HEART:  RRR.  PMI not displaced or sustained,S1 and S2 within normal limits, no S3, no S4, no clicks, no rubs, no murmurs ABD:  Flat, positive bowel sounds normal in frequency in pitch, no bruits, no rebound, no guarding, no midline pulsatile mass, no hepatomegaly, no splenomegaly EXT:  2 plus pulses throughout, no edema, no cyanosis no clubbing SKIN:  No rashes no nodules NEURO:  Cranial nerves II through XII grossly intact, motor grossly intact throughout PSYCH:  Cognitively intact, oriented to person place and time   EKG:  EKG is ordered today. 04/27/2019: Sinus rhythm.  Rate 74 bpm.  First-degree AV block.  PVCs. 12/25/2019: Sinus rhythm.  Rate 92 bpm.  Recent Labs: 04/27/2019: BUN 14; Creatinine, Ser  1.06; Potassium 4.3; Sodium 141 12/11/2019: TSH 1.210    Lipid Panel No results found for: CHOL, TRIG, HDL, CHOLHDL, VLDL, LDLCALC, LDLDIRECT    Wt Readings from Last 3 Encounters:  12/25/19 255 lb (115.7 kg)  12/11/19 257 lb 9.6 oz (116.8 kg)  09/11/19 (!) 260 lb (117.9 kg)     ASSESSMENT AND PLAN:  # Essential hypertension:  Blood pressure remains labile and he is not tolerating hydralazine.  We will discontinue it.  Start clonidine 0.1 mg twice daily.  Is improving but still not at goal.  He did not tolerate doxazosin and his blood pressure is not controlled on carvedilol.  Continue spironolactone and Tribenzor.  He will keep  checking his blood pressure and bring to follow-up.  Renal artery Dopplers are pending.  We will also check catecholamines and metanephrines given that his blood pressures are labile.  Secondary Causes of Hypertension  Medications/Herbal: OCP, steroids, stimulants, antidepressants, weight loss medication, immune suppressants, NSAIDs, sympathomimetics, alcohol, caffeine, licorice, ginseng, St. John's wort, chemo  Sleep Apnea: (testing not indicated)  Renal artery stenosis: Check renal artery Doppler- pending Hyperaldosteronism: will check when BP better controlled and can hold ARB/spironolactone Hyper/hypothyroidism:Check TSH Pheochromocytoma: check metanephrines and catecholamines Cushing's syndrome: (testing not indicated)  Coarctation of the aorta: BP symmetric  # Pure hypercholesterolemia: Lipids are well-controlled on atorvastatin.  # CV Disease Prevention:  Continue atorvastatin.  Stop aspirin.  # Murmur:  Present since youth.  No heart failure on exam or symptoms    Current medicines are reviewed at length with the patient today.  The patient does not have concerns regarding medicines.  The following changes have been made:  no change  Labs/ tests ordered today include:   Orders Placed This Encounter  Procedures  . Catecholamines,  Fractionated, Plasma  . Metanephrines, plasma  . EKG 12-Lead     Disposition:   FU with Kadisha Goodine C. Oval Linsey, MD, [facility] in 4 weeks     Signed, [place] Oval Linsey, MD, [facility]  12/25/2019 2:22 PM    [facility]

## 2019-12-26 ENCOUNTER — Ambulatory Visit: Payer: Medicare Other

## 2019-12-29 LAB — CATECHOLAMINES, FRACTIONATED, PLASMA: Dopamine: <30 pg/mL (ref 0–48)

## 2019-12-30 ENCOUNTER — Other Ambulatory Visit: Payer: Self-pay

## 2019-12-30 ENCOUNTER — Encounter (HOSPITAL_COMMUNITY): Payer: Self-pay | Admitting: Emergency Medicine

## 2019-12-30 ENCOUNTER — Emergency Department (HOSPITAL_COMMUNITY)
Admission: EM | Admit: 2019-12-30 | Discharge: 2019-12-30 | Disposition: A | Discharge status: Home / Self Care | Payer: Medicare Other | Attending: Emergency Medicine | Admitting: Emergency Medicine

## 2019-12-30 ENCOUNTER — Emergency Department (HOSPITAL_COMMUNITY): Payer: Medicare Other

## 2019-12-30 DIAGNOSIS — Z79899 Other long term (current) drug therapy: Secondary | ICD-10-CM | POA: Diagnosis not present

## 2019-12-30 DIAGNOSIS — R202 Paresthesia of skin: Secondary | ICD-10-CM | POA: Diagnosis present

## 2019-12-30 DIAGNOSIS — R200 Anesthesia of skin: Secondary | ICD-10-CM

## 2019-12-30 DIAGNOSIS — I10 Essential (primary) hypertension: Secondary | ICD-10-CM | POA: Insufficient documentation

## 2019-12-30 LAB — COMPREHENSIVE METABOLIC PANEL
ALT: 42 U/L (ref 0–44)
AST: 37 U/L (ref 15–41)
Albumin: 4.2 g/dL (ref 3.5–5.0)
Alkaline Phosphatase: 61 U/L (ref 38–126)
Anion gap: 10 (ref 5–15)
BUN: 12 mg/dL (ref 8–23)
CO2: 27 mmol/L (ref 22–32)
Calcium: 9.9 mg/dL (ref 8.9–10.3)
Chloride: 102 mmol/L (ref 98–111)
Creatinine, Ser: 1.08 mg/dL (ref 0.61–1.24)
GFR, Estimated: >60 mL/min (ref >60)
Glucose, Bld: 146 mg/dL — ABNORMAL HIGH (ref 70–99)
Potassium: 3.8 mmol/L (ref 3.5–5.1)
Sodium: 139 mmol/L (ref 135–145)
Total Bilirubin: 0.6 mg/dL (ref 0.3–1.2)
Total Protein: 7.8 g/dL (ref 6.5–8.1)

## 2019-12-30 LAB — CBC
HCT: 45.9 % (ref 39.0–52.0)
Hemoglobin: 15.2 g/dL (ref 13.0–17.0)
MCH: 31.4 pg (ref 26.0–34.0)
MCHC: 33.1 g/dL (ref 30.0–36.0)
MCV: 94.8 fL (ref 80.0–100.0)
Platelets: 240 10*3/uL (ref 150–400)
RBC: 4.84 MIL/uL (ref 4.22–5.81)
RDW: 12.7 % (ref 11.5–15.5)
WBC: 4.6 10*3/uL (ref 4.0–10.5)
nRBC: 0 % (ref 0.0–0.2)

## 2019-12-30 LAB — DIFFERENTIAL
Abs Immature Granulocytes: 0.02 10*3/uL (ref 0.00–0.07)
Basophils Absolute: 0 10*3/uL (ref 0.0–0.1)
Basophils Relative: 1 %
Eosinophils Absolute: 0.2 10*3/uL (ref 0.0–0.5)
Eosinophils Relative: 4 %
Immature Granulocytes: 0 %
Lymphocytes Relative: 29 %
Lymphs Abs: 1.3 10*3/uL (ref 0.7–4.0)
Monocytes Absolute: 0.5 10*3/uL (ref 0.1–1.0)
Monocytes Relative: 11 %
Neutro Abs: 2.5 10*3/uL (ref 1.7–7.7)
Neutrophils Relative %: 55 %

## 2019-12-30 LAB — APTT: aPTT: 27 seconds (ref 24–36)

## 2019-12-30 LAB — CATECHOLAMINES, FRACTIONATED, PLASMA
Epinephrine: 74 pg/mL — ABNORMAL HIGH (ref 0–62)
Norepinephrine: 1100 pg/mL — ABNORMAL HIGH (ref 0–874)

## 2019-12-30 LAB — METANEPHRINES, PLASMA
Metanephrine, Free: 36.7 pg/mL (ref 0.0–88.0)
Normetanephrine, Free: 151.9 pg/mL (ref 0.0–285.2)

## 2019-12-30 LAB — PROTIME-INR
INR: 1 (ref 0.8–1.2)
Prothrombin Time: 13.1 seconds (ref 11.4–15.2)

## 2019-12-30 MED ORDER — SODIUM CHLORIDE 0.9% FLUSH: 3.0000 mL | Freq: Once | INTRAVENOUS | Status: DC

## 2019-12-30 NOTE — ED Provider Notes (Signed)
[place] EMERGENCY DEPARTMENT Provider Note   CSN: [id number] Arrival date & time: 12/30/19  1347     History Chief Complaint  Patient presents with  . Weakness    Douglas Proctor is a 79 y.o. male.  Patient to ED for evaluation of a brief episode of right leg numbness that occurred around noon today and lasted less than 2 minutes. He states he went to the bathroom and after he stood "it was like the leg wasn't there". His wife helped him walk to the bed and reports he was able to move the leg to walk and bear full weight. Symptom of numbness was gone before he got back to his bed. He checked his blood pressure and found it to be 210/120. No chest pain, SOB, diaphoresis, other extremity numbness or weakness, headache. EMS was called and his blood pressure had started to decrease by the time they arrived. He has not had any recurrent numbness.   The history is provided by the patient. No language interpreter was used.  Weakness Associated symptoms: no fever and no headaches        Past Medical History:  Diagnosis Date  . Essential hypertension 04/27/2019  . Murmur 12/11/2019  . Pure hypercholesterolemia 04/27/2019    Patient Active Problem List   Diagnosis Date Noted  . Murmur 12/11/2019  . Pure hypercholesterolemia 04/27/2019  . Essential hypertension 04/27/2019  . 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 HLD (hyperlipidemia) 08/12/2013  . LBP (low back pain) 08/12/2013  . B12 deficiency 08/12/2013    History reviewed. No pertinent surgical history.     Family History  Problem Relation Age of Onset  . CAD Father   . Bradycardia Sister     Social History   Tobacco Use  . Smoking status: Never Smoker  . Smokeless tobacco: Never Used  Substance Use Topics  . Alcohol use: Yes    Alcohol/week: 1.0 standard drink    Types: 1 Glasses of wine per week  . Drug use: Not on file    Home Medications Prior to Admission medications     Medication Sig Start Date End Date Taking? Authorizing Provider  ascorbic acid (VITAMIN C) 500 MG/5ML syrup Take by mouth daily.    [provider]  atorvastatin (LIPITOR) 10 MG tablet Take 10 mg by mouth daily.    [provider]  Cholecalciferol 25 MCG (1000 UT) tablet Take 2,000 Units by mouth.     [provider]  cloNIDine (CATAPRES) 0.1 MG tablet Take 1 tablet (0.1 mg total) by mouth 2 (two) times daily. 12/25/19   [name], MD  docusate sodium (COLACE) 100 MG capsule Take 100 mg by mouth daily as needed for mild constipation.    [provider]  Ferrous Sulfate (IRON) 325 (65 Fe) MG TABS Take 1 tablet by mouth every other day.    [provider]  ibuprofen (ADVIL,MOTRIN) 800 MG tablet Take 800 mg by mouth every 6 (six) hours as needed for headache or moderate pain.  05/07/15   [provider]  Multiple Vitamin (MULTIVITAMIN ADULT PO) Take by mouth.    [provider]  Olmesartan-amLODIPine-HCTZ 40-10-25 MG TABS Take 1 tablet by mouth daily. 12/11/19   [name], MD  sildenafil (VIAGRA) 50 MG tablet Take 1/2-1 tab prior to sexual activity 06/10/19   [provider]  spironolactone (ALDACTONE) 25 MG tablet Take 1 tablet (25 mg total) by mouth daily. 03/27/19 12/11/19  [name], MD  XALATAN 0.005 % ophthalmic solution Place 1 drop into both eyes at bedtime.  05/07/15   [provider]    Allergies    Lisinopril, Other, and Oysters [shellfish allergy]  Review of Systems   Review of Systems  Constitutional: Negative for chills and fever.  HENT: Negative.   Respiratory: Negative.   Cardiovascular: Negative.   Gastrointestinal: Negative.   Musculoskeletal: Negative.   Skin: Negative.   Neurological: Positive for numbness. Negative for facial asymmetry, speech difficulty, weakness and headaches.    Physical Exam Updated Vital Signs BP 137/82 (BP Location: Left Arm)   Pulse 84   Temp  98.3 F (36.8 C) (Oral)   Resp 18   Ht 6\' 5"  (1.956 m)   Wt 115.7 kg   SpO2 99%   BMI 30.24 kg/m   Physical Exam Vitals and nursing note reviewed.  Constitutional:      Appearance: He is well-developed.  HENT:     Head: Normocephalic.  Neck:     Vascular: No carotid bruit.  Cardiovascular:     Rate and Rhythm: Normal rate and regular rhythm.     Heart sounds: Murmur heard.   Pulmonary:     Effort: Pulmonary effort is normal.     Breath sounds: Normal breath sounds.  Abdominal:     General: Bowel sounds are normal.     Palpations: Abdomen is soft.     Tenderness: There is no abdominal tenderness. There is no guarding or rebound.  Musculoskeletal:        General: Normal range of motion.     Cervical back: Normal range of motion and neck supple.  Skin:    General: Skin is warm and dry.     Findings: No rash.  Neurological:     Mental Status: He is alert and oriented to person, place, and time.     Sensory: No sensory deficit.     Coordination: Coordination normal.     Comments: CN's 3-12 grossly intact. Speech is clear and focused. No facial asymmetry. No lateralizing weakness. Reflexes are equal. No deficits of coordination.      ED Results / Procedures / Treatments   Labs (all labs ordered are listed, but only abnormal results are displayed) Labs Reviewed  COMPREHENSIVE METABOLIC PANEL - Abnormal; Notable for the following components:      Result Value   Glucose, Bld 146 (*)    All other components within normal limits  PROTIME-INR  APTT  CBC  DIFFERENTIAL   Results for orders placed or performed during the hospital encounter of 12/30/19  Protime-INR  Result Value Ref Range   Prothrombin Time 13.1 11.4 - 15.2 seconds   INR 1.0 0.8 - 1.2  APTT  Result Value Ref Range   aPTT 27 24 - 36 seconds  CBC  Result Value Ref Range   WBC 4.6 4.0 - 10.5 K/uL   RBC 4.84 4.22 - 5.81 MIL/uL   Hemoglobin 15.2 13.0 - 17.0 g/dL   HCT 45.9 39 - 52 %   MCV 94.8 80.0 -  100.0 fL   MCH 31.4 26.0 - 34.0 pg   MCHC 33.1 30.0 - 36.0 g/dL   RDW 12.7 11.5 - 15.5 %   Platelets 240 150 - 400 K/uL   nRBC 0.0 0.0 - 0.2 %  Differential  Result Value Ref Range   Neutrophils Relative % 55 %   Neutro Abs 2.5 1.7 - 7.7 K/uL   Lymphocytes Relative 29 %   Lymphs  Abs 1.3 0.7 - 4.0 K/uL   Monocytes Relative 11 %   Monocytes Absolute 0.5 0.1 - 1.0 K/uL   Eosinophils Relative 4 %   Eosinophils Absolute 0.2 0.0 - 0.5 K/uL   Basophils Relative 1 %   Basophils Absolute 0.0 0.0 - 0.1 K/uL   Immature Granulocytes 0 %   Abs Immature Granulocytes 0.02 0.00 - 0.07 K/uL  Comprehensive metabolic panel  Result Value Ref Range   Sodium 139 135 - 145 mmol/L   Potassium 3.8 3.5 - 5.1 mmol/L   Chloride 102 98 - 111 mmol/L   CO2 27 22 - 32 mmol/L   Glucose, Bld 146 (H) 70 - 99 mg/dL   BUN 12 8 - 23 mg/dL   Creatinine, Ser 1.08 0.61 - 1.24 mg/dL   Calcium 9.9 8.9 - 10.3 mg/dL   Total Protein 7.8 6.5 - 8.1 g/dL   Albumin 4.2 3.5 - 5.0 g/dL   AST 37 15 - 41 U/L   ALT 42 0 - 44 U/L   Alkaline Phosphatase 61 38 - 126 U/L   Total Bilirubin 0.6 0.3 - 1.2 mg/dL   GFR, Estimated >60 >60 mL/min   Anion gap 10 5 - 15   EKG None  Radiology CT HEAD WO CONTRAST  Result Date: 12/30/2019 CLINICAL DATA:  Bilateral facial tingling and dizziness. Hypertensive. EXAM: CT HEAD WITHOUT CONTRAST TECHNIQUE: Contiguous axial images were obtained from the base of the skull through the vertex without intravenous contrast. COMPARISON:  03/02/2016 FINDINGS: Brain: There is no evidence of an acute infarct, intracranial hemorrhage, mass, midline shift, or extra-axial fluid collection. There is chronic thin diffuse dural calcification along the falx and tentorium. The ventricles and sulci are within normal limits for age. No significant white matter disease is seen for age. Vascular: Calcified atherosclerosis at the skull base. No hyperdense vessel. Skull: No fracture or suspicious osseous lesion.  Sinuses/Orbits: Visualized paranasal sinuses and mastoid air cells are clear. Unremarkable orbits. Other: None. IMPRESSION: No evidence of acute intracranial abnormality. Electronically Signed   By: Logan Bores M.D.   On: 12/30/2019 15:14    Procedures Procedures (including critical care time)  Medications Ordered in ED Medications  sodium chloride flush (NS) 0.9 % injection 3 mL (has no administration in time range)    ED Course  I have reviewed the triage vital signs and the nursing notes.  Pertinent labs & imaging results that were available during my care of the patient were reviewed by me and considered in my medical decision making (see chart for details).    MDM Rules/Calculators/A&P                          Patient to ED with ss/sxs as detailed in HPI.  He has a normal neurologic exam. No weakness at any point, sensation change only. Work up, including head CT, all essentially negative. Doubt symptoms today reflect TIA, CVA.  He has been seen by Dr. Ronnald Nian who feels he is appropriate for discharge home.   Final Clinical Impression(s) / ED Diagnoses Final diagnoses:  None   1. Numbness  Rx / DC Orders ED Discharge Orders    None       Charlann Lange, PA-C 12/30/19 [place], [place], DO 12/30/19 1804

## 2019-12-30 NOTE — ED Triage Notes (Signed)
Pt to triage via [facility] from home.  Reports R leg weakness that started at 12pm and lasted 5-10 min.  Initial BP was 208/-.  No arm drift.  Denies any complaints at present.

## 2019-12-30 NOTE — Discharge Instructions (Addendum)
Continue your current medications as prescribed.   Follow up with Dr. Oval Linsey for recheck tomorrow. Return to the emergency department with any new or concerning symptoms.

## 2019-12-31 ENCOUNTER — Telehealth: Payer: Self-pay | Admitting: *Deleted

## 2019-12-31 DIAGNOSIS — I10 Essential (primary) hypertension: Secondary | ICD-10-CM

## 2019-12-31 DIAGNOSIS — R899 Unspecified abnormal finding in specimens from other organs, systems and tissues: Secondary | ICD-10-CM

## 2019-12-31 NOTE — Telephone Encounter (Signed)
Advised patient and orders placed

## 2019-12-31 NOTE — Telephone Encounter (Signed)
-----   Message from [name], MD sent at 12/31/2019 12:28 PM EST ----- Labs are mildly abnormal.  Please have him do a 24 hour urine collection for catecholamines and metanephrines.  Also urine creatinine.

## 2020-01-01 ENCOUNTER — Ambulatory Visit (HOSPITAL_COMMUNITY)
Admission: RE | Admit: 2020-01-01 | Discharge: 2020-01-01 | Disposition: A | Discharge status: Home / Self Care | Payer: Medicare Other | Source: Ambulatory Visit | Attending: Cardiovascular Disease | Admitting: Cardiovascular Disease

## 2020-01-01 ENCOUNTER — Other Ambulatory Visit: Payer: Self-pay

## 2020-01-01 ENCOUNTER — Ambulatory Visit (INDEPENDENT_AMBULATORY_CARE_PROVIDER_SITE_OTHER): Payer: Medicare Other | Admitting: Pharmacist

## 2020-01-01 VITALS — BP 130/82 | HR 105 | Resp 16 | Ht 77.0 in | Wt 256.6 lb

## 2020-01-01 DIAGNOSIS — I10 Essential (primary) hypertension: Secondary | ICD-10-CM

## 2020-01-01 MED ORDER — SPIRONOLACTONE 50 MG PO TABS: 50.0000 mg | ORAL_TABLET | Freq: Every day | ORAL | 1 refills | Status: DC

## 2020-01-01 NOTE — Progress Notes (Signed)
Patient ID: Douglas Proctor                 DOB: [birth year]                      MRN: [id number]     HPI: Douglas Proctor is a 79 y.o. male referred by Dr. Oval Linsey to HTN clinic. PMH includes hypertension, pre-diabetes, hyperlipidemia, and low back pain.  Noted intolerance to multiple BP medication with recent visit to ED after developing numbness with clonidine. Also complain of constipation and is currently taking docusate sodium every other day with iron supplement.  Current HTN meds:  Olmesartan-amlodipine-HCTZ 40-10-25mg  daily Spironolactone 25mg  daily  Previously tried:  Lisinopril - angioedema Clonidine - ADR felt sick Carvedilol - ineffective Hydralazine    BP goal: 130/80  Family History: The patient's family history includes Bradycardia in his sister; CAD in his father.   Social History: 1 glass of wine per week, denies tobacco use  Diet: mostly home cooked meals with limited sodium inatek  Exercise: stationary recumbent bike 3-4 times per week for 30 minutes at a time.  Home BP readings: no records provided  Wt Readings from Last 3 Encounters:  01/01/20 256 lb 9.6 oz (116.4 kg)  12/30/19 255 lb (115.7 kg)  12/25/19 255 lb (115.7 kg)   BP Readings from Last 3 Encounters:  01/01/20 130/82  12/30/19 133/80  12/25/19 (!) 150/78   Pulse Readings from Last 3 Encounters:  01/01/20 (!) 105  12/30/19 79  12/25/19 92    Past Medical History:  Diagnosis Date  . Essential hypertension 04/27/2019  . Murmur 12/11/2019  . Pure hypercholesterolemia 04/27/2019    Current Outpatient Medications on File Prior to Visit  Medication Sig Dispense Refill  . ascorbic acid (VITAMIN C) 500 MG/5ML syrup Take by mouth daily.    Marland Kitchen atorvastatin (LIPITOR) 10 MG tablet Take 10 mg by mouth daily.    . Cholecalciferol 25 MCG (1000 UT) tablet Take 2,000 Units by mouth.     . docusate sodium (COLACE) 100 MG capsule Take 100 mg by mouth daily as needed for mild constipation.    . Ferrous  Sulfate (IRON) 325 (65 Fe) MG TABS Take 1 tablet by mouth every other day.    . ibuprofen (ADVIL,MOTRIN) 800 MG tablet Take 800 mg by mouth every 6 (six) hours as needed for headache or moderate pain.     . Multiple Vitamin (MULTIVITAMIN ADULT PO) Take by mouth.    . Olmesartan-amLODIPine-HCTZ 40-10-25 MG TABS Take 1 tablet by mouth daily. 90 tablet 3  . sildenafil (VIAGRA) 50 MG tablet Take 1/2-1 tab prior to sexual activity    . XALATAN 0.005 % ophthalmic solution Place 1 drop into both eyes at bedtime.      No current facility-administered medications on file prior to visit.    Allergies  Allergen Reactions  . Clonidine Derivatives     FELT SICK AND STATED FELT LIKE SPIDER WEBS ON HIM  . Lisinopril Swelling    Lips and face swelling  . Other     Cats and dogs dander   . Oysters [Shellfish Allergy] Other (See Comments)    Unknown exact reaction- was tested    Blood pressure 130/82, pulse (!) 105, resp. rate 16, height 6\' 5"  (1.956 m), weight 256 lb 9.6 oz (116.4 kg), SpO2 96 %.  Essential hypertension Blood pressure 140s initially, down to 130s after resting for few minutes. Noted ADR to multiple  short acting medication for BP, also noted very high BP numbers after clonidine usage suggestive of some rebound effect.  Mr Antillon is currently on olmesartan/amlodipine/HCTZ and spironolactone therapy. Most recent BMET shows stable renal function and electrolytes. 24 hours urine collection and renal ultrasound pending. Will increase spironolactone to 50mg  daily. Ptinett was instructed to take spironolactone mid-morning and repeat BMET in 2 weeks. Follow up already scheduled with Dr Oval Linsey.   Tashea Othman Rodriguez-Guzman PharmD, BCPS, [place] [id number] 01/04/2020 1:09 PM

## 2020-01-01 NOTE — Patient Instructions (Addendum)
Return for a  follow up appointment in 4 weeks  Go to the lab in 2 weeks  Check your blood pressure at home daily (if able) and keep record of the readings.  Take your BP meds as follows: * INCREASE spironolactone dose to 50mg  daily (take at noon)*  * Okay to take 2 tablets of 25mg  until new dose come in mail*  *Take stool softener every day for 2 week, then daily as needed for constipation   Bring all of your meds, your BP cuff and your record of home blood pressures to your next appointment.  Exercise as you're able, try to walk approximately 30 minutes per day.  Keep salt intake to a minimum, especially watch canned and prepared boxed foods.  Eat more fresh fruits and vegetables and fewer canned items.  Avoid eating in fast food restaurants.    HOW TO TAKE YOUR BLOOD PRESSURE: . Rest 5 minutes before taking your blood pressure. .  Don't smoke or drink caffeinated beverages for at least 30 minutes before. . Take your blood pressure before (not after) you eat. . Sit comfortably with your back supported and both feet on the floor (don't cross your legs). . Elevate your arm to heart level on a table or a desk. . Use the proper sized cuff. It should fit smoothly and snugly around your bare upper arm. There should be enough room to slip a fingertip under the cuff. The bottom edge of the cuff should be 1 inch above the crease of the elbow. . Ideally, take 3 measurements at one sitting and record the average.

## 2020-01-04 ENCOUNTER — Telehealth: Payer: Self-pay

## 2020-01-04 NOTE — Telephone Encounter (Signed)
See MyChart message 01/04/20

## 2020-01-04 NOTE — Assessment & Plan Note (Signed)
Blood pressure 140s initially, down to 130s after resting for few minutes. Noted ADR to multiple short acting medication for BP, also noted very high BP numbers after clonidine usage suggestive of some rebound effect.  Douglas Proctor is currently on olmesartan/amlodipine/HCTZ and spironolactone therapy. Most recent BMET shows stable renal function and electrolytes. 24 hours urine collection and renal ultrasound pending. Will increase spironolactone to 50mg  daily. Douglas Proctor was instructed to take spironolactone mid-morning and repeat BMET in 2 weeks. Follow up already scheduled with Dr Oval Linsey.

## 2020-01-04 NOTE — Telephone Encounter (Signed)
Called to speak with pt. Relayed Pharmacist's recommendations to temporarily hold iron supplements and to reach out to primary care provider. The patient verbalizes understanding and agreement with plan.

## 2020-01-05 LAB — CATECHOLAMINES, FRACTIONATED, URINE, 24 HOUR

## 2020-01-08 ENCOUNTER — Other Ambulatory Visit: Payer: Self-pay

## 2020-01-08 ENCOUNTER — Encounter: Payer: Self-pay | Admitting: Cardiovascular Disease

## 2020-01-08 ENCOUNTER — Ambulatory Visit (INDEPENDENT_AMBULATORY_CARE_PROVIDER_SITE_OTHER): Payer: Medicare Other | Admitting: Cardiovascular Disease

## 2020-01-08 VITALS — BP 128/78 | HR 96 | Ht 77.0 in | Wt 253.0 lb

## 2020-01-08 DIAGNOSIS — I10 Essential (primary) hypertension: Secondary | ICD-10-CM

## 2020-01-08 DIAGNOSIS — E7800 Pure hypercholesterolemia, unspecified: Secondary | ICD-10-CM

## 2020-01-08 DIAGNOSIS — I701 Atherosclerosis of renal artery: Secondary | ICD-10-CM | POA: Diagnosis not present

## 2020-01-08 LAB — METANEPHRINES, URINE, 24 HOUR
Metaneph Total, Ur: 47 ug/L
Metanephrines, 24H Ur: 115 ug/24 hr (ref 58–276)
Normetanephrine, 24H Ur: 502 ug/24 hr (ref 156–729)
Normetanephrine, Ur: 205 ug/L

## 2020-01-08 LAB — CREATININE, URINE, 24 HOUR
Creatinine, 24H Ur: 1495 mg/24 hr (ref 1000–2000)
Creatinine, Urine: 61 mg/dL

## 2020-01-08 LAB — CATECHOLAMINES, FRACTIONATED, URINE, 24 HOUR

## 2020-01-08 NOTE — Progress Notes (Signed)
Cardiology Office Note   Date:  02/08/2020   ID:  Douglas Proctor, DOB Dec 04, 1940, MRN [id number]  PCP:  Heywood Bene, PA-C  Cardiologist:   [name], MD   No chief complaint on file.    History of Present Illness: Douglas Proctor is a 79 y.o. male with hypertension and hyperlipidemia here for follow-up.  He was initially seen 03/2019 for management of hypertension. He was first diagnosed with hypertension in the 1970s.  It was previously well-controlled prior to the pandemic.  However lately he has not been able to go to the gym.  Prior to that he was started on Terazosin after his blood pressure was in the 150s over 90s at [facility].  Terazosin was discontinued as he had not seen any improvement in his blood pressure.  He was started on spironolactone instead.  He has tolerated this well.  His blood pressures have been mostly in the 130s to 140s over 80s.  He has started back exercising.  He uses the recumbent bike at least 3 to 4 days/week.  He rides it for 30 minutes and has no exertional chest pain or shortness of breath.  He has some mild lower extremity edema by the end of the day that improves with elevation and compression stockings.  He has no orthopnea or PND.  His wife does most of the cooking and she limits their salt intake.  He continues to help take care of his grandchildren and enjoys doing this.  At his last appointment Douglas Proctor blood pressure was slightly above goal.  He wanted to keep working on diet and exercise before making any changes.   He followed up with Douglas Proctor on 06/2019 and his blood pressure is nearly at goal.  He was started on doxazosin 1 mg nightly and subsequently increased to 2 mg.  His blood pressure was well-controlled but he developed urinary symptoms.  At his last appointment BP was not controlled and didn't change with the addition of carvedilol.  Carvedilol was stopped and he started hydralazine.  Since then his blood pressure  continues to be labile.  He notes that about 5 hours after taking his medication he starts to feel poorly.  He feels lightheaded and has a spiderweb-like sensation on the side of his face.  He also notes some intermittent pain under his left nipple.  The pain is random and does not occur with exertion.  There is no associated shortness of breath, lightheadedness, nausea or diaphoresis.  The episodes last for less than a minute and never when he is exercising.  He continues to have no edema and denies orthopnea or PND.  This morning he had some cough and mucus.  He has no fevers or chills and otherwise feels well.  He continues to exercise regularly and has no exertional symptoms.  At his last appointment Douglas Proctor was not tolerating hydralazine.  This was discontinued and he was started on clonidine.  Less than a week later he was in the ED after an episode of right leg numbness.  At home his blood pressure was also elevated.  By the time he got to the ED he was neurologically intact with no evidence of stroke.  His blood pressure had improved to 137/82.  He followed up with our pharmacist 11/16 and his blood pressure was 130/82.  It was noted that he had several numbers that were spiking and clonidine was discontinued.  Spironolactone was increased to 50 mg.  Secondary hypertension work-up revealed elevated serum norepinephrine and epinephrine levels.  Urine studies were obtained and are pending.  His BP continues to be stable at home.  He has been constipated and has some abdominal discomfort. He denies chest pain and his breathing is stable.  He continue to be able to exercise without difficulty.  Previous mediations: Carvedilol- ineffective doxazisin-urinary symptoms Hydralazine Clonidine- BP spiked  Past Medical History:  Diagnosis Date  . Constipation   . Erectile dysfunction   . Essential hypertension 04/27/2019  . Murmur 12/11/2019  . Pure hypercholesterolemia 04/27/2019  . 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    Past Surgical History:  Procedure Laterality Date  . FOOT SURGERY Left    broken toe, bunion removed  . SHOULDER SURGERY Right      Current Outpatient Medications  Medication Sig Dispense Refill  . ascorbic acid (VITAMIN C) 500 MG/5ML syrup Take by mouth daily.    Marland Kitchen atorvastatin (LIPITOR) 10 MG tablet Take 10 mg by mouth daily.    . Chlorpheniramine-Acetaminophen (CORICIDIN HBP COLD/FLU PO) Take by mouth as needed.     . Cholecalciferol 25 MCG (1000 UT) tablet Take 2,000 Units by mouth.     . Fexofenadine HCl (MUCINEX ALLERGY PO) Take by mouth as needed.     . Magnesium Hydroxide (MILK OF MAGNESIA PO) Take by mouth daily as needed.    . Multiple Vitamin (MULTIVITAMIN ADULT PO) Take by mouth.    . senna (SENOKOT) 8.6 MG tablet Take 2 tablets once daily as needed for constipation    . sildenafil (VIAGRA) 50 MG tablet Take 1/2-1 tab prior to sexual activity    . XALATAN 0.005 % ophthalmic solution Place 1 drop into both eyes at bedtime.     Marland Kitchen amLODipine (NORVASC) 10 MG tablet Take 10 mg by mouth daily.    Marland Kitchen gabapentin (NEURONTIN) 100 MG capsule Take 1 capsule (100 mg total) by mouth 3 (three) times daily. 90 capsule 3  . hydrochlorothiazide (HYDRODIURIL) 25 MG tablet Take 25 mg by mouth daily.    . polyethylene glycol (MIRALAX / GLYCOLAX) 17 g packet Take 17 g by mouth in the morning, at noon, and at bedtime.    . sucralfate (CARAFATE) 1 GM/10ML suspension Take 10 mLs (1 g total) by mouth 4 (four) times daily -  with meals and at bedtime. 420 mL 0  . valsartan (DIOVAN) 160 MG tablet Take 1 tablet (160 mg total) by mouth 2 (two) times daily. 60 tablet 1   No current facility-administered medications for this visit.    Allergies:   Clonidine derivatives, Lisinopril, Other, Oysters [shellfish allergy], and Spironolactone    Social History:  The patient  reports that he has never smoked. He has never used smokeless  tobacco. He reports current alcohol use. He reports that he does not use drugs.   Family History:  The patient's family history includes CAD in his father; Other in his mother.    ROS:  Please see the history of present illness.   Otherwise, review of systems are positive for none.   All other systems are reviewed and negative.    PHYSICAL EXAM: VS:  BP 128/78   Pulse 96   Ht 6\' 5"  (1.956 m)   Wt 253 lb (114.8 kg)   BMI 30.00 kg/m  , BMI Body mass index is 30 kg/m. GENERAL:  Well appearing HEENT:  Pupils equal round and reactive, fundi not visualized,  oral mucosa unremarkable NECK:  No jugular venous distention, waveform within normal limits, carotid upstroke brisk and symmetric, no bruits LUNGS:  Clear to auscultation bilaterally HEART:  RRR.  PMI not displaced or sustained,S1 and S2 within normal limits, no S3, no S4, no clicks, no rubs, no murmurs ABD:  Flat, positive bowel sounds normal in frequency in pitch, no bruits, no rebound, no guarding, no midline pulsatile mass, no hepatomegaly, no splenomegaly EXT:  2 plus pulses throughout, no edema, no cyanosis no clubbing SKIN:  No rashes no nodules NEURO:  Cranial nerves II through XII grossly intact, motor grossly intact throughout PSYCH:  Cognitively intact, oriented to person place and time   EKG:  EKG is not ordered today. 04/27/2019: Sinus rhythm.  Rate 74 bpm.  First-degree AV block.  PVCs. 12/25/2019: Sinus rhythm.  Rate 92 bpm.  Recent Labs: 01/31/2020: Magnesium 2.1; TSH 0.730 02/07/2020: ALT 33; BUN 10; Creatinine, Ser 1.05; Hemoglobin 14.4; Platelets 232; Potassium 3.5; Sodium 142    Lipid Panel No results found for: CHOL, TRIG, HDL, CHOLHDL, VLDL, LDLCALC, LDLDIRECT    Wt Readings from Last 3 Encounters:  02/07/20 248 lb (112.5 kg)  01/21/20 249 lb (112.9 kg)  01/20/20 250 lb (113.4 kg)     ASSESSMENT AND PLAN:  # Essential hypertension:  Blood pressure has been much more stable.   Continue current regimen  of olmesartan/amlodipine/HCTZ and spironolactone.  Plasma catecholamines were mildly elevated.  Will check 24 hour urine catecholamines and metanephrines.  Renal artery Dopplers showed mild blockage on the L and none on the R.    Secondary Causes of Hypertension  Medications/Herbal: OCP, steroids, stimulants, antidepressants, weight loss medication, immune suppressants, NSAIDs, sympathomimetics, alcohol, caffeine, licorice, ginseng, St. John's wort, chemo  Sleep Apnea: (testing not indicated)  Renal artery stenosis: Mild 12/2019. Hyperaldosteronism: adrenals normal on CT 12/2019 Hyper/hypothyroidism:TSH normal 11/2019 Pheochromocytoma: check 24 hour urine metanephrines and catecholamines Cushing's syndrome: (testing not indicated)  Coarctation of the aorta: BP symmetric  # Pure hypercholesterolemia: Lipids are well-controlled on atorvastatin.  # CV Disease Prevention:  Continue atorvastatin.  Stop aspirin.  # Renal artery stenosis:   Mild stenosis of the left renal artery on 12/2019.  Repeat study 12/2020.   Current medicines are reviewed at length with the patient today.  The patient does not have concerns regarding medicines.  The following changes have been made:  no change  Labs/ tests ordered today include:   No orders of the defined types were placed in this encounter.    Disposition:   FU with Necia Proctor C. Oval Linsey, MD, [facility] in 4 weeks     Signed, [place] Oval Linsey, MD, [facility]  02/08/2020 5:37 PM    [place]

## 2020-01-08 NOTE — Patient Instructions (Addendum)
Medication Instructions:  OK TO TAKE MILK OF MAGNESIA  DAILY AS NEEDED   *If you need a refill on your cardiac medications before your next appointment, please call your pharmacy*  Lab Work: BMET TODAY   If you have labs (blood work) drawn today and your tests are completely normal, you will receive your results only by: Marland Kitchen MyChart Message (if you have MyChart) OR . A paper copy in the mail If you have any lab test that is abnormal or we need to change your treatment, we will call you to review the results.  Testing/Procedures: NONE  Follow-Up: At [facility], you and your health needs are our priority.  As part of our continuing mission to provide you with exceptional heart care, we have created designated Provider Care Teams.  These Care Teams include your primary Cardiologist (physician) and Advanced Practice Providers (APPs -  Physician Assistants and Nurse Practitioners) who all work together to provide you with the care you need, when you need it.  We recommend signing up for the patient portal called "MyChart".  Sign up information is provided on this After Visit Summary.  MyChart is used to connect with patients for Virtual Visits (Telemedicine).  Patients are able to view lab/test results, encounter notes, upcoming appointments, etc.  Non-urgent messages can be sent to your provider as well.   To learn more about what you can do with MyChart, go to [email or web address].    Your next appointment:   3 month(s)  The format for your next appointment:   In Person  Provider:   You may see [name], MD or one of the following Advanced Practice Providers on your designated Care Team:    Kerin Ransom, PA-C  [place], [place]  Coletta Memos, [place]

## 2020-01-09 LAB — BASIC METABOLIC PANEL
BUN/Creatinine Ratio: 11 (ref 10–24)
BUN: 14 mg/dL (ref 8–27)
CO2: 25 mmol/L (ref 20–29)
Calcium: 9.9 mg/dL (ref 8.6–10.2)
Chloride: 101 mmol/L (ref 96–106)
Creatinine, Ser: 1.28 mg/dL — ABNORMAL HIGH (ref 0.76–1.27)
GFR calc Af Amer: 62 mL/min/{1.73_m2} (ref >59)
GFR calc non Af Amer: 53 mL/min/{1.73_m2} — ABNORMAL LOW (ref >59)
Glucose: 107 mg/dL — ABNORMAL HIGH (ref 65–99)
Potassium: 5.2 mmol/L (ref 3.5–5.2)
Sodium: 141 mmol/L (ref 134–144)

## 2020-01-14 ENCOUNTER — Telehealth: Payer: Self-pay | Admitting: *Deleted

## 2020-01-14 NOTE — Telephone Encounter (Signed)
Patient had what is believed to be allergic reaction from Spironolactone  On 01/10/2020 swelling in face and mouth, he stopped and took Benadryl  Blood pressure ok and swelling better now  Marked chart as allergy to Spironolactone

## 2020-01-16 ENCOUNTER — Other Ambulatory Visit: Payer: Self-pay | Admitting: Gastroenterology

## 2020-01-16 DIAGNOSIS — R109 Unspecified abdominal pain: Secondary | ICD-10-CM

## 2020-01-16 DIAGNOSIS — R634 Abnormal weight loss: Secondary | ICD-10-CM

## 2020-01-17 ENCOUNTER — Encounter (HOSPITAL_COMMUNITY): Payer: Self-pay | Admitting: Emergency Medicine

## 2020-01-17 ENCOUNTER — Emergency Department (HOSPITAL_COMMUNITY): Payer: Medicare Other

## 2020-01-17 ENCOUNTER — Emergency Department (HOSPITAL_COMMUNITY)
Admission: EM | Admit: 2020-01-17 | Discharge: 2020-01-18 | Disposition: A | Discharge status: Home / Self Care | Payer: Medicare Other | Attending: Emergency Medicine | Admitting: Emergency Medicine

## 2020-01-17 DIAGNOSIS — R110 Nausea: Secondary | ICD-10-CM | POA: Diagnosis not present

## 2020-01-17 DIAGNOSIS — R079 Chest pain, unspecified: Secondary | ICD-10-CM | POA: Diagnosis not present

## 2020-01-17 DIAGNOSIS — R109 Unspecified abdominal pain: Secondary | ICD-10-CM | POA: Diagnosis present

## 2020-01-17 DIAGNOSIS — K5900 Constipation, unspecified: Secondary | ICD-10-CM | POA: Diagnosis not present

## 2020-01-17 DIAGNOSIS — Z20822 Contact with and (suspected) exposure to covid-19: Secondary | ICD-10-CM | POA: Diagnosis not present

## 2020-01-17 DIAGNOSIS — I10 Essential (primary) hypertension: Secondary | ICD-10-CM | POA: Insufficient documentation

## 2020-01-17 DIAGNOSIS — Z79899 Other long term (current) drug therapy: Secondary | ICD-10-CM | POA: Insufficient documentation

## 2020-01-17 DIAGNOSIS — R0602 Shortness of breath: Secondary | ICD-10-CM | POA: Insufficient documentation

## 2020-01-17 LAB — LIPASE, BLOOD: Lipase: 19 U/L (ref 11–51)

## 2020-01-17 LAB — RESP PANEL BY RT-PCR (FLU A&B, COVID) ARPGX2
Influenza A by PCR: NEGATIVE
Influenza B by PCR: NEGATIVE
SARS Coronavirus 2 by RT PCR: NEGATIVE

## 2020-01-17 LAB — HEPATIC FUNCTION PANEL
ALT: 34 U/L (ref 0–44)
AST: 35 U/L (ref 15–41)
Albumin: 4.1 g/dL (ref 3.5–5.0)
Alkaline Phosphatase: 52 U/L (ref 38–126)
Bilirubin, Direct: 0.2 mg/dL (ref 0.0–0.2)
Indirect Bilirubin: 0.4 mg/dL (ref 0.3–0.9)
Total Bilirubin: 0.6 mg/dL (ref 0.3–1.2)
Total Protein: 7.7 g/dL (ref 6.5–8.1)

## 2020-01-17 LAB — TROPONIN I (HIGH SENSITIVITY)
Troponin I (High Sensitivity): 5 ng/L (ref <18)
Troponin I (High Sensitivity): 6 ng/L (ref <18)

## 2020-01-17 LAB — BASIC METABOLIC PANEL
Anion gap: 10 (ref 5–15)
BUN: 12 mg/dL (ref 8–23)
CO2: 27 mmol/L (ref 22–32)
Calcium: 9.7 mg/dL (ref 8.9–10.3)
Chloride: 101 mmol/L (ref 98–111)
Creatinine, Ser: 1.09 mg/dL (ref 0.61–1.24)
GFR, Estimated: >60 mL/min (ref >60)
Glucose, Bld: 139 mg/dL — ABNORMAL HIGH (ref 70–99)
Potassium: 3.6 mmol/L (ref 3.5–5.1)
Sodium: 138 mmol/L (ref 135–145)

## 2020-01-17 LAB — URINALYSIS, ROUTINE W REFLEX MICROSCOPIC
Bilirubin Urine: NEGATIVE
Glucose, UA: NEGATIVE mg/dL
Hgb urine dipstick: NEGATIVE
Ketones, ur: NEGATIVE mg/dL
Leukocytes,Ua: NEGATIVE
Nitrite: NEGATIVE
Protein, ur: NEGATIVE mg/dL
Specific Gravity, Urine: 1.01 (ref 1.005–1.030)
pH: 6 (ref 5.0–8.0)

## 2020-01-17 LAB — CBC
HCT: 43.4 % (ref 39.0–52.0)
Hemoglobin: 14.7 g/dL (ref 13.0–17.0)
MCH: 31.6 pg (ref 26.0–34.0)
MCHC: 33.9 g/dL (ref 30.0–36.0)
MCV: 93.3 fL (ref 80.0–100.0)
Platelets: 236 10*3/uL (ref 150–400)
RBC: 4.65 MIL/uL (ref 4.22–5.81)
RDW: 12.8 % (ref 11.5–15.5)
WBC: 3.8 10*3/uL — ABNORMAL LOW (ref 4.0–10.5)
nRBC: 0 % (ref 0.0–0.2)

## 2020-01-17 LAB — CK: Total CK: 99 U/L (ref 49–397)

## 2020-01-17 MED ORDER — LIDOCAINE VISCOUS HCL 2 % MT SOLN
15.0000 mL | Freq: Once | OROMUCOSAL | Status: AC
  Administered 2020-01-17: 15 mL via ORAL
  Filled 2020-01-17: qty 15

## 2020-01-17 MED ORDER — SODIUM CHLORIDE 0.9 % IV BOLUS
1000.0000 mL | Freq: Once | INTRAVENOUS | Status: AC
  Administered 2020-01-17: 1000 mL via INTRAVENOUS

## 2020-01-17 MED ORDER — IOHEXOL 300 MG/ML  SOLN
100.0000 mL | Freq: Once | INTRAMUSCULAR | Status: AC | PRN
  Administered 2020-01-17: 100 mL via INTRAVENOUS

## 2020-01-17 MED ORDER — ALUM & MAG HYDROXIDE-SIMETH 200-200-20 MG/5ML PO SUSP
30.0000 mL | Freq: Once | ORAL | Status: AC
  Administered 2020-01-17: 30 mL via ORAL
  Filled 2020-01-17: qty 30

## 2020-01-17 NOTE — ED Provider Notes (Signed)
Douglas Proctor   CSN: [id number] Arrival date & time: 01/17/20  1053     History Chief Complaint  Patient presents with  . Abdominal Pain    Douglas Proctor is a 79 y.o. male.  See MDM for prolonged H&P  The history is provided by the patient and a caregiver.  Illness Location:  General Quality:  Pain, discomfort, tingling Severity:  Mild Onset quality:  Gradual Timing:  Intermittent Progression:  Waxing and waning Chronicity:  New Context:  See MDM Relieved by:  Nothing Worsened by:  Nothing Associated symptoms: chest pain, fatigue, nausea and shortness of breath   Associated symptoms: no abdominal pain, no cough, no ear pain, no fever, no rash, no sore throat and no vomiting        Past Medical History:  Diagnosis Date  . Essential hypertension 04/27/2019  . Murmur 12/11/2019  . Pure hypercholesterolemia 04/27/2019    Patient Active Problem List   Diagnosis Date Noted  . Murmur 12/11/2019  . Pure hypercholesterolemia 04/27/2019  . Essential hypertension 04/27/2019  . 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 HLD (hyperlipidemia) 08/12/2013  . LBP (low back pain) 08/12/2013  . B12 deficiency 08/12/2013    History reviewed. No pertinent surgical history.     Family History  Problem Relation Age of Onset  . CAD Father   . Bradycardia Sister     Social History   Tobacco Use  . Smoking status: Never Smoker  . Smokeless tobacco: Never Used  Substance Use Topics  . Alcohol use: Yes    Alcohol/week: 1.0 standard drink    Types: 1 Glasses of wine per week  . Drug use: Not on file    Home Medications Prior to Admission medications   Medication Sig Start Date End Date Taking? Authorizing Provider  ascorbic acid (VITAMIN C) 500 MG/5ML syrup Take by mouth daily.    [provider]  atorvastatin (LIPITOR) 10 MG tablet Take 10 mg by mouth daily.    [provider]    Chlorpheniramine-Acetaminophen (CORICIDIN HBP COLD/FLU PO) Take by mouth.    [provider]  Cholecalciferol 25 MCG (1000 UT) tablet Take 2,000 Units by mouth.     [provider]  docusate sodium (COLACE) 100 MG capsule Take 100 mg by mouth daily as needed for mild constipation.    [provider]  Ferrous Sulfate (IRON) 325 (65 Fe) MG TABS Take 1 tablet by mouth every other day.    [provider]  Fexofenadine HCl (MUCINEX ALLERGY PO) Take by mouth.    [provider]  ibuprofen (ADVIL,MOTRIN) 800 MG tablet Take 800 mg by mouth every 6 (six) hours as needed for headache or moderate pain.  05/07/15   [provider]  Magnesium Hydroxide (MILK OF MAGNESIA PO) Take by mouth daily as needed.    [provider]  Multiple Vitamin (MULTIVITAMIN ADULT PO) Take by mouth.    [provider]  Olmesartan-amLODIPine-HCTZ 40-10-25 MG TABS Take 1 tablet by mouth daily. 12/11/19   [name], MD  senna (SENOKOT) 8.6 MG tablet Take 2 tablets once daily as needed for constipation 01/05/20   [provider]  sildenafil (VIAGRA) 50 MG tablet Take 1/2-1 tab prior to sexual activity 06/10/19   [provider]  XALATAN 0.005 % ophthalmic solution Place 1 drop into both eyes at bedtime.  05/07/15   [provider]    Allergies    Clonidine derivatives, Lisinopril,  Other, Oysters [shellfish allergy], and Spironolactone  Review of Systems   Review of Systems  Constitutional: Positive for fatigue. Negative for chills and fever.  HENT: Negative for ear pain and sore throat.   Eyes: Negative for pain and visual disturbance.  Respiratory: Positive for shortness of breath. Negative for cough.   Cardiovascular: Positive for chest pain. Negative for palpitations.  Gastrointestinal: Positive for constipation and nausea. Negative for abdominal pain and vomiting.  Genitourinary: Negative for dysuria and hematuria.   Musculoskeletal: Negative for arthralgias and back pain.  Skin: Negative for color change and rash.  Neurological: Positive for weakness. Negative for seizures and syncope.  All other systems reviewed and are negative.   Physical Exam Updated Vital Signs  ED Triage Vitals [01/17/20 1111]  Enc Vitals Group     BP (!) 152/80     Pulse Rate 84     Resp 20     Temp 98.4 F (36.9 C)     Temp Source Oral     SpO2 98 %     Weight 250 lb (113.4 kg)     Height 6\' 5"  (1.956 m)     Head Circumference      Peak Flow      Pain Score      Pain Loc      Pain Edu?      Excl. in [place]?     Physical Exam Vitals and nursing Proctor reviewed.  Constitutional:      General: He is not in acute distress.    Appearance: He is well-developed. He is not ill-appearing.  HENT:     Head: Normocephalic and atraumatic.     Mouth/Throat:     Mouth: Mucous membranes are moist.     Pharynx: Oropharynx is clear.  Eyes:     Extraocular Movements: Extraocular movements intact.     Conjunctiva/sclera: Conjunctivae normal.  Cardiovascular:     Rate and Rhythm: Normal rate and regular rhythm.     Heart sounds: Normal heart sounds. No murmur heard.   Pulmonary:     Effort: Pulmonary effort is normal. No respiratory distress.     Breath sounds: Normal breath sounds.  Abdominal:     General: Abdomen is flat. There is no distension.     Palpations: Abdomen is soft.     Tenderness: There is no abdominal tenderness. There is no right CVA tenderness, left CVA tenderness, guarding or rebound. Negative signs include Murphy's sign, Rovsing's sign, McBurney's sign and psoas sign.  Musculoskeletal:     Cervical back: Neck supple.  Skin:    General: Skin is warm and dry.     Capillary Refill: Capillary refill takes less than 2 seconds.  Neurological:     General: No focal deficit present.     Mental Status: He is alert and oriented to person, place, and time.     Cranial Nerves: No cranial nerve deficit.      Motor: No weakness.  Psychiatric:        Mood and Affect: Mood normal.     ED Results / Procedures / Treatments   Labs (all labs ordered are listed, but only abnormal results are displayed) Labs Reviewed  BASIC METABOLIC PANEL - Abnormal; Notable for the following components:      Result Value   Glucose, Bld 139 (*)    All other components within normal limits  CBC - Abnormal; Notable for the following components:   WBC 3.8 (*)    All  other components within normal limits  RESP PANEL BY RT-PCR (FLU A&B, COVID) [id number]  HEPATIC FUNCTION PANEL  LIPASE, BLOOD  URINALYSIS, ROUTINE W REFLEX MICROSCOPIC  CK  TROPONIN I (HIGH SENSITIVITY)  TROPONIN I (HIGH SENSITIVITY)    EKG EKG Interpretation  Date/Time:  Thursday January 17 2020 18:28:10 EST Ventricular Rate:  70 PR Interval:  172 QRS Duration: 105 QT Interval:  415 QTC Calculation: 448 R Axis:   18 Text Interpretation: Sinus rhythm Atrial premature complex Confirmed by Lennice Sites [phone]) on 01/17/2020 6:43:39 PM   Radiology DG Chest 2 View  Result Date: 01/17/2020 CLINICAL DATA:  Short of breath.  History of hypertension. EXAM: CHEST - 2 VIEW COMPARISON:  None. FINDINGS: Cardiac silhouette normal in size. No mediastinal or hilar masses or evidence of adenopathy. Clear lungs.  No pleural effusion or pneumothorax. Skeletal structures are intact. IMPRESSION: No active cardiopulmonary disease. Electronically Signed   By: Lajean Manes M.D.   On: 01/17/2020 11:33   CT ABDOMEN PELVIS W CONTRAST  Result Date: 01/17/2020 CLINICAL DATA:  Bilateral lower extremity heaviness, shortness of breath, constipated EXAM: CT ABDOMEN AND PELVIS WITH CONTRAST TECHNIQUE: Multidetector CT imaging of the abdomen and pelvis was performed using the standard protocol following bolus administration of intravenous contrast. CONTRAST:  153mL OMNIPAQUE IOHEXOL 300 MG/ML  SOLN COMPARISON:  None. FINDINGS: Lower chest: No acute pleural or parenchymal  lung disease. Hepatobiliary: No focal liver abnormality is seen. No gallstones, gallbladder wall thickening, or biliary dilatation. Pancreas: Unremarkable. No pancreatic ductal dilatation or surrounding inflammatory changes. Spleen: Normal in size without focal abnormality. Adrenals/Urinary Tract: Multiple bilateral renal cortical cysts. Otherwise the kidneys enhance normally. No urinary tract calculi or obstructive uropathy. Bladder and adrenals are unremarkable. Stomach/Bowel: No bowel obstruction or ileus. Moderate retained stool throughout the colon. Normal appendix right lower quadrant. No bowel wall thickening or inflammatory change. Vascular/Lymphatic: Aortic atherosclerosis. No enlarged abdominal or pelvic lymph nodes. Reproductive: Prostate is unremarkable. Other: No free fluid or free gas.  No abdominal wall hernia. Musculoskeletal: No acute or destructive bony lesions. Subcutaneous lipoma left flank, measuring 7.8 by 4.9 by 6.2 cm. Reconstructed images demonstrate no additional findings. IMPRESSION: 1. No acute intra-abdominal or intrapelvic process. 2. Moderate fecal retention consistent with constipation. 3.  Aortic Atherosclerosis (ICD10-I70.0). Electronically Signed   By: Randa Ngo M.D.   On: 01/17/2020 22:46    Procedures Procedures (including critical care time)  Medications Ordered in ED Medications  alum & mag hydroxide-simeth (MAALOX/MYLANTA) 200-200-20 MG/5ML suspension 30 mL (30 mLs Oral Given 01/17/20 2054)    And  lidocaine (XYLOCAINE) 2 % viscous mouth solution 15 mL (15 mLs Oral Given 01/17/20 2054)  sodium chloride 0.9 % bolus 1,000 mL (0 mLs Intravenous Stopped 01/17/20 2334)  iohexol (OMNIPAQUE) 300 MG/ML solution 100 mL (100 mLs Intravenous Contrast Given 01/17/20 2223)    ED Course  I have reviewed the triage vital signs and the nursing notes.  Pertinent labs & imaging results that were available during my care of the patient were reviewed by me and considered in my  medical decision making (see chart for details).    MDM Rules/Calculators/A&P                          NEHAN FLAUM is a 79 year old male with history of high cholesterol, hypertension who presents to the ED with multiple complaints. Patient with normal vitals. No fever. It appears that he is most concerned about some  abdominal discomfort he has been having. He has been started on MiraLAX by GI.  Patient has not had great improvement with that yet.  He is having some chest discomfort, shortness of breath, abdominal pain, paresthesias throughout his body, body aches.  Overall he is having wide ranging symptoms.  I saw him recently for some numbness and he had a negative head CT.  Overall he appears well.  He has no real focal abdominal pain on exam.  He is neurologically intact on exam.  He has strong pulses throughout.  He has a normal EKG and normal troponins and doubt ACS.  He has no risk factors for PE and doubt PE.  No infection found on chest x-ray.  No significant anemia, electrolyte abnormality, kidney injury.  Will focus on his abdominal discomfort and get a CT scan of his abdomen and pelvis including hepatic function panel, lipase, urine studies.  We will also get a CK given some spasms that he has been having to make sure he is not having any musculoskeletal breakdown.  Will get urinalysis as well.  Will give GI cocktail and fluid bolus and reevaluate.  Patient with overall unremarkable work-up.  Repeat troponin normal.  CK normal.  CT of the abdomen and pelvis shows constipation but does not show any other acute intra-abdominal process.  Liver enzymes, lipase normal.  Urinalysis negative for infection.  Overall suspect constipation causing most of the patient's issues.  He already follows with GI.  For his intermittent spasms of his legs will have him follow-up with neurology and primary care.  This could be related to constipation or could be restless leg type syndrome.  Discharge in good  condition.  Understands return precautions.  This chart was dictated using voice recognition software.  Despite best efforts to proofread,  errors can occur which can change the documentation meaning.    Final Clinical Impression(s) / ED Diagnoses Final diagnoses:  Constipation, unspecified constipation type    Rx / DC Orders ED Discharge Orders    None       Lennice Sites, DO 01/18/20 0013

## 2020-01-17 NOTE — ED Triage Notes (Signed)
Pt reports he woke up with bilateral "leg heaviness" that he woke up with this morning, also states that he woke up with some sob as well. He states that he has been constipated for a month as well.

## 2020-01-18 NOTE — ED Notes (Signed)
DC instructions reviewed with patient and wife and both verbalized understanding. vss and nad. Patient ambulatory while leaving.

## 2020-01-20 ENCOUNTER — Emergency Department (HOSPITAL_COMMUNITY)
Admission: EM | Admit: 2020-01-20 | Discharge: 2020-01-20 | Disposition: A | Discharge status: Home / Self Care | Payer: Medicare Other | Attending: Emergency Medicine | Admitting: Emergency Medicine

## 2020-01-20 ENCOUNTER — Other Ambulatory Visit: Payer: Self-pay

## 2020-01-20 ENCOUNTER — Encounter (HOSPITAL_COMMUNITY): Payer: Self-pay | Admitting: Emergency Medicine

## 2020-01-20 DIAGNOSIS — Z79899 Other long term (current) drug therapy: Secondary | ICD-10-CM | POA: Insufficient documentation

## 2020-01-20 DIAGNOSIS — R55 Syncope and collapse: Secondary | ICD-10-CM | POA: Insufficient documentation

## 2020-01-20 DIAGNOSIS — K5909 Other constipation: Secondary | ICD-10-CM

## 2020-01-20 DIAGNOSIS — F419 Anxiety disorder, unspecified: Secondary | ICD-10-CM | POA: Diagnosis not present

## 2020-01-20 DIAGNOSIS — K5900 Constipation, unspecified: Secondary | ICD-10-CM | POA: Insufficient documentation

## 2020-01-20 DIAGNOSIS — R202 Paresthesia of skin: Secondary | ICD-10-CM

## 2020-01-20 DIAGNOSIS — I10 Essential (primary) hypertension: Secondary | ICD-10-CM | POA: Diagnosis not present

## 2020-01-20 DIAGNOSIS — R0789 Other chest pain: Secondary | ICD-10-CM

## 2020-01-20 LAB — COMPREHENSIVE METABOLIC PANEL
ALT: 42 U/L (ref 0–44)
AST: 38 U/L (ref 15–41)
Albumin: 4 g/dL (ref 3.5–5.0)
Alkaline Phosphatase: 48 U/L (ref 38–126)
Anion gap: 7 (ref 5–15)
BUN: 10 mg/dL (ref 8–23)
CO2: 28 mmol/L (ref 22–32)
Calcium: 9.1 mg/dL (ref 8.9–10.3)
Chloride: 103 mmol/L (ref 98–111)
Creatinine, Ser: 0.94 mg/dL (ref 0.61–1.24)
GFR, Estimated: >60 mL/min (ref >60)
Glucose, Bld: 122 mg/dL — ABNORMAL HIGH (ref 70–99)
Potassium: 3.7 mmol/L (ref 3.5–5.1)
Sodium: 138 mmol/L (ref 135–145)
Total Bilirubin: 0.4 mg/dL (ref 0.3–1.2)
Total Protein: 7.1 g/dL (ref 6.5–8.1)

## 2020-01-20 LAB — CBC
HCT: 40.7 % (ref 39.0–52.0)
Hemoglobin: 13.5 g/dL (ref 13.0–17.0)
MCH: 31.2 pg (ref 26.0–34.0)
MCHC: 33.2 g/dL (ref 30.0–36.0)
MCV: 94 fL (ref 80.0–100.0)
Platelets: 216 10*3/uL (ref 150–400)
RBC: 4.33 MIL/uL (ref 4.22–5.81)
RDW: 12.6 % (ref 11.5–15.5)
WBC: 4.2 10*3/uL (ref 4.0–10.5)
nRBC: 0 % (ref 0.0–0.2)

## 2020-01-20 LAB — TROPONIN I (HIGH SENSITIVITY): Troponin I (High Sensitivity): 5 ng/L (ref <18)

## 2020-01-20 MED ORDER — HYDROXYZINE HCL 25 MG PO TABS: 25.0000 mg | ORAL_TABLET | Freq: Three times a day (TID) | ORAL | 0 refills | Status: DC | PRN

## 2020-01-20 NOTE — ED Provider Notes (Addendum)
[facility] DEPT Provider Note   CSN: [id number] Arrival date & time: 01/20/20  1430     History Chief Complaint  Patient presents with  . Constipation  . Near Syncope    Douglas Proctor is a 79 y.o. male.  Patient presents via EMS, notes transient sharp, mid chest pain today, at rest, lasting a few seconds. No associated nv, diaphoresis or sob. No palpitations. No loc. Denies any other recent chest pain or discomfort. No unusual doe or fatigue. Also states intermittent constipation for months, however last bm yesterday/normal. No abd pain or distension. No nausea/vomiting. Also notes intermittent bilateral foot and bilateral hand numbness/tingling in past several weeks, without acute or abrupt change today. No neck or back pain. No radicular pain. No weakness or loss of normal functional ability. Denies headache. No change in speech or vision. States has seen pcp, ED and cardiology for same. States is trying to get in with neurologist as well. States he has been worried/stressed by his variety of symptoms. Denies depression.   The history is provided by the patient and the EMS personnel.  Constipation Associated symptoms: no abdominal pain, no back pain, no diarrhea, no dysuria, no fever and no vomiting   Near Syncope Associated symptoms include chest pain. Pertinent negatives include no abdominal pain, no headaches and no shortness of breath.       Past Medical History:  Diagnosis Date  . Essential hypertension 04/27/2019  . Murmur 12/11/2019  . Pure hypercholesterolemia 04/27/2019    Patient Active Problem List   Diagnosis Date Noted  . Murmur 12/11/2019  . Pure hypercholesterolemia 04/27/2019  . Essential hypertension 04/27/2019  . 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 HLD (hyperlipidemia) 08/12/2013  . LBP (low back pain) 08/12/2013  . B12 deficiency 08/12/2013    History reviewed. No pertinent surgical history.     Family History   Problem Relation Age of Onset  . CAD Father   . Bradycardia Sister     Social History   Tobacco Use  . Smoking status: Never Smoker  . Smokeless tobacco: Never Used  Substance Use Topics  . Alcohol use: Yes    Alcohol/week: 1.0 standard drink    Types: 1 Glasses of wine per week  . Drug use: Not on file    Home Medications Prior to Admission medications   Medication Sig Start Date End Date Taking? Authorizing Provider  ascorbic acid (VITAMIN C) 500 MG/5ML syrup Take by mouth daily.    [provider]  atorvastatin (LIPITOR) 10 MG tablet Take 10 mg by mouth daily.    [provider]  Chlorpheniramine-Acetaminophen (CORICIDIN HBP COLD/FLU PO) Take by mouth.    [provider]  Cholecalciferol 25 MCG (1000 UT) tablet Take 2,000 Units by mouth.     [provider]  docusate sodium (COLACE) 100 MG capsule Take 100 mg by mouth daily as needed for mild constipation.    [provider]  Ferrous Sulfate (IRON) 325 (65 Fe) MG TABS Take 1 tablet by mouth every other day.    [provider]  Fexofenadine HCl (MUCINEX ALLERGY PO) Take by mouth.    [provider]  ibuprofen (ADVIL,MOTRIN) 800 MG tablet Take 800 mg by mouth every 6 (six) hours as needed for headache or moderate pain.  05/07/15   [provider]  Magnesium Hydroxide (MILK OF MAGNESIA PO) Take by mouth daily as needed.    [provider]  Multiple Vitamin (MULTIVITAMIN ADULT PO)  Take by mouth.    [provider]  Olmesartan-amLODIPine-HCTZ 40-10-25 MG TABS Take 1 tablet by mouth daily. 12/11/19   [name], MD  senna (SENOKOT) 8.6 MG tablet Take 2 tablets once daily as needed for constipation 01/05/20   [provider]  sildenafil (VIAGRA) 50 MG tablet Take 1/2-1 tab prior to sexual activity 06/10/19   [provider]  XALATAN 0.005 % ophthalmic solution Place 1 drop into both eyes at bedtime.  05/07/15   [provider]    Allergies    Clonidine derivatives, Lisinopril, Other, Oysters [shellfish allergy], and Spironolactone  Review of Systems   Review of Systems  Constitutional: Negative for chills and fever.  HENT: Negative for sore throat and trouble swallowing.   Eyes: Negative for redness and visual disturbance.  Respiratory: Negative for shortness of breath.   Cardiovascular: Positive for chest pain and near-syncope. Negative for palpitations and leg swelling.  Gastrointestinal: Positive for constipation. Negative for abdominal pain, diarrhea and vomiting.  Endocrine: Negative for polyuria.  Genitourinary: Negative for dysuria and flank pain.  Musculoskeletal: Negative for back pain and neck pain.  Skin: Negative for rash.  Neurological: Negative for syncope, speech difficulty, weakness and headaches.  Hematological: Does not bruise/bleed easily.  Psychiatric/Behavioral: Negative for confusion. The patient is nervous/anxious.     Physical Exam Updated Vital Signs BP (!) 154/85 (BP Location: Right Arm)   Pulse 88   Temp 98 F (36.7 C) (Oral)   Resp 18   Ht 1.956 m (6\' 5" )   Wt 113.4 kg   SpO2 96%   BMI 29.65 kg/m   Physical Exam Vitals and nursing note reviewed.  Constitutional:      Appearance: Normal appearance. He is well-developed.  HENT:     Head: Atraumatic.     Nose: Nose normal.     Mouth/Throat:     Mouth: Mucous membranes are moist.     Pharynx: Oropharynx is clear.  Eyes:     General: No scleral icterus.    Conjunctiva/sclera: Conjunctivae normal.     Pupils: Pupils are equal, round, and reactive to light.  Neck:     Vascular: No carotid bruit.     Trachea: No tracheal deviation.     Comments: Trachea midline. Thyroid not grossly enlarged or tender.  Cardiovascular:     Rate and Rhythm: Normal rate and regular rhythm.     Pulses: Normal pulses.     Heart sounds: Normal heart sounds. No murmur heard.  No friction rub. No gallop.   Pulmonary:      Effort: Pulmonary effort is normal. No accessory muscle usage or respiratory distress.     Breath sounds: Normal breath sounds.  Abdominal:     General: Bowel sounds are normal. There is no distension.     Palpations: Abdomen is soft. There is no mass.     Tenderness: There is no abdominal tenderness. There is no guarding or rebound.     Hernia: No hernia is present.  Genitourinary:    Comments: No cva tenderness. Musculoskeletal:        General: No swelling or tenderness.     Cervical back: Normal range of motion and neck supple. No rigidity.     Right lower leg: No edema.     Left lower leg: No edema.     Comments: CTLS spine, non tender, aligned, no step off.   Skin:    General: Skin is warm and dry.  Findings: No rash.  Neurological:     General: No focal deficit present.     Mental Status: He is alert and oriented to person, place, and time.     Comments: Alert, speech clear. Motor/sens grossly intact bil. Steady gait.   Psychiatric:     Comments: Mildly anxious.      ED Results / Procedures / Treatments   Labs (all labs ordered are listed, but only abnormal results are displayed) Results for orders placed or performed during the hospital encounter of 01/20/20  CBC  Result Value Ref Range   WBC 4.2 4.0 - 10.5 K/uL   RBC 4.33 4.22 - 5.81 MIL/uL   Hemoglobin 13.5 13.0 - 17.0 g/dL   HCT 40.7 39 - 52 %   MCV 94.0 80.0 - 100.0 fL   MCH 31.2 26.0 - 34.0 pg   MCHC 33.2 30.0 - 36.0 g/dL   RDW 12.6 11.5 - 15.5 %   Platelets 216 150 - 400 K/uL   nRBC 0.0 0.0 - 0.2 %  Comprehensive metabolic panel  Result Value Ref Range   Sodium 138 135 - 145 mmol/L   Potassium 3.7 3.5 - 5.1 mmol/L   Chloride 103 98 - 111 mmol/L   CO2 28 22 - 32 mmol/L   Glucose, Bld 122 (H) 70 - 99 mg/dL   BUN 10 8 - 23 mg/dL   Creatinine, Ser 0.94 0.61 - 1.24 mg/dL   Calcium 9.1 8.9 - 10.3 mg/dL   Total Protein 7.1 6.5 - 8.1 g/dL   Albumin 4.0 3.5 - 5.0 g/dL   AST 38 15 - 41 U/L   ALT 42 0 -  44 U/L   Alkaline Phosphatase 48 38 - 126 U/L   Total Bilirubin 0.4 0.3 - 1.2 mg/dL   GFR, Estimated >60 >60 mL/min   Anion gap 7 5 - 15  Troponin I (High Sensitivity)  Result Value Ref Range   Troponin I (High Sensitivity) 5 <18 ng/L    EKG EKG Interpretation  Date/Time:  Sunday January 20 2020 14:48:15 EST Ventricular Rate:  89 PR Interval:    QRS Duration: 109 QT Interval:  373 QTC Calculation: 454 R Axis:   -32 Text Interpretation: Sinus rhythm No significant change since last tracing Confirmed by Lajean Saver [id number]) on 01/20/2020 4:52:05 PM   Radiology No results found.  Procedures Procedures (including critical care time)  Medications Ordered in ED Medications - No data to display  ED Course  I have reviewed the triage vital signs and the nursing notes.  Pertinent labs & imaging results that were available during my care of the patient were reviewed by me and considered in my medical decision making (see chart for details).    MDM Rules/Calculators/A&P                         Labs ordered. Pt with very recent cxr, ct abd, for same symptoms - neg then. Recent ct head neg.   Reviewed nursing notes and prior charts for additional history.  Recent evals in ED, pcp, cardiology reviewed. Pt with recent ct imaging of abd/pelvis and ct imaging of head - negative for acute process. Recent labs including chemistries, tsh, etc, also unremarkable.   Labs reviewed/interpreted by me - chem normal.   Pt currently appears stable for discharge. Question whether possibly anxiety is playing a role in multi-symptoms/symptomatology over course of past months.   Rec pcp f/u.  Pt/spouse unhappy with  d/c plan. I tried to reassure/review recent results, currently exam/normal neuro exam, normal vitals, today's and prior lab and imaging results, etc, and reinforce plan for close pcp f/u, and to have pcp coordinate further outpt eval and/or outpatient speciality consult/eval. Pt is  ambulatory to bathroom with steady gait, and without faintness or unsteadiness. Po fluids/snack offered.   Return precautions also provided.     Final Clinical Impression(s) / ED Diagnoses Final diagnoses:  None    Rx / DC Orders ED Discharge Orders    None        Lajean Saver, MD 01/20/20 1744

## 2020-01-20 NOTE — Discharge Instructions (Signed)
It was our pleasure to provide your ER care today - we hope that you feel better.  For your recent symptoms today's lab tests look good/normal. Your recent CT scans of head, abdomen/pelvis, and cxr also look good.   Follow up with your doctor in the coming week.  If you wish to follow up with neurologist - see information provided, and call office Monday for appointment.   If feeling anxious, you may try hydroxyzine as need for symptom relief - may make drowsy, no driving when taking.   Return to ER if worse, new symptoms, fevers, trouble breathing, new or severe pain, one-side of body numbness/weakness, change in speech or vision, recurrent or persistent chest pain, or other concern.

## 2020-01-20 NOTE — ED Triage Notes (Signed)
BIBA Per EMS:  Pt coming from home with complaints of a near syncope episode in the bathroom today. Pt has been experiencing 3-4 wks of constipation with lower abd pain.  Tachy and diaphoretic on EMS arrival  Vitals:  146/92 92 HR 18 RR CBG 204 97% RA  20 G L AC

## 2020-01-20 NOTE — ED Notes (Signed)
Pt ambulatory to restroom

## 2020-01-20 NOTE — ED Notes (Signed)
This RN went to room to discharge pt. Pt states he cannot be discharged at this time because he still has not gotten answers and is still not feeling well. EDP made aware.

## 2020-01-21 ENCOUNTER — Encounter: Payer: Self-pay | Admitting: Neurology

## 2020-01-21 ENCOUNTER — Ambulatory Visit (INDEPENDENT_AMBULATORY_CARE_PROVIDER_SITE_OTHER): Payer: Medicare Other | Admitting: Neurology

## 2020-01-21 VITALS — BP 126/75 | HR 77 | Ht 77.0 in | Wt 249.0 lb

## 2020-01-21 DIAGNOSIS — R202 Paresthesia of skin: Secondary | ICD-10-CM

## 2020-01-21 DIAGNOSIS — G459 Transient cerebral ischemic attack, unspecified: Secondary | ICD-10-CM | POA: Diagnosis not present

## 2020-01-21 DIAGNOSIS — E538 Deficiency of other specified B group vitamins: Secondary | ICD-10-CM

## 2020-01-21 NOTE — Progress Notes (Signed)
Reason for visit: Paresthesias, head pressure  Referring physician: Dr. Glena Proctor is a 79 y.o. male  History of present illness:  Douglas Proctor is a 79 year old right-handed black male with a history of hypertension.  The patient was started on some blood pressure medications in early 2021, he has been on a myriad of different blood pressure pills without good tolerance, he has been switched to a combination medication that includes olmesartan, amlodipine, and hydrochlorothiazide.  The patient has had a 52-monthhistory of some problems with head pressure and crawling sensations on the head, occasional sharp jabs of pain that may occur.  The sensations on both sides of the head.  On 30 December 2019, the patient had a 30-minute episode of right leg numbness, difficulty walking after he was sitting on the toilet.  He went to the emergency room and they felt that he had gotten brief sensation symptoms associated with sitting on the toilet.  The patient's not had recurrence of this but he has had some heaviness in the legs that came on on 17 January 2020, this was also associated with chest pains, work-up in the emergency room was unremarkable.  The patient has had episodes of abdominal discomfort associated constipation, he has had a 10 pound weight loss.  He may have episodes of shortness of breath and diaphoresis.  The patient occasionally will feel as if the toes on his feet are moving, but they are not.  He may occasionally have some low back pain on the right side.  He reports no significant neck discomfort.  A CT scan of the brain was done and was unremarkable.  He is sent to this office for an evaluation.  He does report urinary frequency at times.  Past Medical History:  Diagnosis Date  . Constipation   . Erectile dysfunction   . Essential hypertension 04/27/2019  . Murmur 12/11/2019  . Pure hypercholesterolemia 04/27/2019    Past Surgical History:  Procedure Laterality Date    . FOOT SURGERY Left    broken toe, bunion removed  . SHOULDER SURGERY Right     Family History  Problem Relation Age of Onset  . Other Mother        hemorrhage - pt was 89years old  . CAD Father     Social history:  reports that he has never smoked. He has never used smokeless tobacco. He reports current alcohol use. He reports that he does not use drugs.  Medications:  Prior to Admission medications   Medication Sig Start Date End Date Taking? Authorizing Provider  ascorbic acid (VITAMIN C) 500 MG/5ML syrup Take by mouth daily.    [provider]  atorvastatin (LIPITOR) 10 MG tablet Take 10 mg by mouth daily.    [provider]  Chlorpheniramine-Acetaminophen (CORICIDIN HBP COLD/FLU PO) Take by mouth.    [provider]  Cholecalciferol 25 MCG (1000 UT) tablet Take 2,000 Units by mouth.     [provider]  docusate sodium (COLACE) 100 MG capsule Take 100 mg by mouth daily as needed for mild constipation.    [provider]  Ferrous Sulfate (IRON) 325 (65 Fe) MG TABS Take 1 tablet by mouth every other day.    [provider]  Fexofenadine HCl (MUCINEX ALLERGY PO) Take by mouth.    [provider]  hydrOXYzine (ATARAX/VISTARIL) 25 MG tablet Take 1 tablet (25 mg total) by mouth every 8 (eight) hours as needed. 01/20/20  Lajean Saver, MD  ibuprofen (ADVIL,MOTRIN) 800 MG tablet Take 800 mg by mouth every 6 (six) hours as needed for headache or moderate pain.  05/07/15   [provider]  Magnesium Hydroxide (MILK OF MAGNESIA PO) Take by mouth daily as needed.    [provider]  Multiple Vitamin (MULTIVITAMIN ADULT PO) Take by mouth.    [provider]  Olmesartan-amLODIPine-HCTZ 40-10-25 MG TABS Take 1 tablet by mouth daily. 12/11/19   [name], MD  senna (SENOKOT) 8.6 MG tablet Take 2 tablets once daily as needed for constipation 01/05/20   [provider]  sildenafil (VIAGRA) 50  MG tablet Take 1/2-1 tab prior to sexual activity 06/10/19   [provider]  XALATAN 0.005 % ophthalmic solution Place 1 drop into both eyes at bedtime.  05/07/15   [provider]      Allergies  Allergen Reactions  . Clonidine Derivatives     FELT SICK AND STATED FELT LIKE SPIDER WEBS ON HIM  . Lisinopril Swelling    Lips and face swelling  . Other     Cats and dogs dander   . Oysters [Shellfish Allergy] Other (See Comments)    Unknown exact reaction- was tested  . Spironolactone     Swelling in lips/face     ROS:  Out of a complete 14 system review of symptoms, the patient complains only of the following symptoms, and all other reviewed systems are negative.  Head pressure, crawling sensations Abdominal discomfort, constipation Walking difficulty, knee pain  Blood pressure 126/75, pulse 77, height 6' 5"  (1.956 m), weight 249 lb (112.9 kg).  Physical Exam  General: The patient is alert and cooperative at the time of the examination.  Eyes: Pupils are equal, round, and reactive to light. Discs are flat bilaterally.  Neck: The neck is supple, no carotid bruits are noted.  Respiratory: The respiratory examination is clear.  Cardiovascular: The cardiovascular examination reveals a regular rate and rhythm, no obvious murmurs or rubs are noted.  Skin: Extremities are without significant edema.  Neurologic Exam  Mental status: The patient is alert and oriented x 3 at the time of the examination. The patient has apparent normal recent and remote memory, with an apparently normal attention span and concentration ability.  Cranial nerves: Facial symmetry is present. There is good sensation of the face to pinprick and soft touch bilaterally. The strength of the facial muscles and the muscles to head turning and shoulder shrug are normal bilaterally. Speech is well enunciated, no aphasia or dysarthria is noted. Extraocular movements are full. Visual fields are  full. The tongue is midline, and the patient has symmetric elevation of the soft palate. No obvious hearing deficits are noted.  Motor: The motor testing reveals 5 over 5 strength of all 4 extremities. Good symmetric motor tone is noted throughout.  Sensory: Sensory testing is intact to pinprick, soft touch, vibration sensation, and position sense on all 4 extremities, with exception there is some decreased vibration sensation in both feet. No evidence of extinction is noted.  Coordination: Cerebellar testing reveals good finger-nose-finger and heel-to-shin bilaterally.  Gait and station: Gait is slightly wide-based, knees are flexed with walking, he wears knee braces on both knees.  Tandem gait is not attempted.  Romberg is negative.  Reflexes: Deep tendon reflexes are symmetric, but are slightly depressed bilaterally. Toes are downgoing bilaterally.   CT head 12/30/19:  IMPRESSION: No evidence of acute intracranial abnormality.  * CT scan images were  reviewed online. I agree with the written report.    Assessment/Plan:  1.  Head pressure, crawling sensations on the head  2.  Transient right leg numbness  2.  Chronic constipation, abdominal discomfort  The patient has had a myriad of different symptoms, he has had a lot of intolerances to his blood pressure medications that he could be having medication side effects.  The patient has been given hydroxyzine through the emergency room, he may try this for the head crawling sensations and itching.  If this is not effective, we may try low-dose gabapentin in the future.  We will be set up for carotid Doppler study, he will have blood work done today.  He will follow-up in 3 months.  Douglas Alexanders MD 01/21/2020 4:12 PM  [facility] [address] [place], [place] [address]  Phone [phone] Fax [phone]

## 2020-01-22 ENCOUNTER — Telehealth: Payer: Self-pay | Admitting: Pharmacist

## 2020-01-22 ENCOUNTER — Inpatient Hospital Stay: Admission: RE | Admit: 2020-01-22 | Payer: Medicare Other | Source: Ambulatory Visit

## 2020-01-22 LAB — B. BURGDORFI ANTIBODIES

## 2020-01-22 NOTE — Telephone Encounter (Signed)
Constipated , but otherwise feeling well.  Will STOP Olmesartan/amlodipine/HCTZ combination tablet.   RESUME OLD REGIMEN OF amlodipine 10mg  daily, valsartan 320mg  daily, HCTZ 25mg  daily.  *Continue taking Miralax as directed by GI doctor.  *Keep follow up appointment with pharmacist in 1 week*

## 2020-01-23 ENCOUNTER — Other Ambulatory Visit: Payer: Self-pay

## 2020-01-23 ENCOUNTER — Telehealth: Payer: Self-pay | Admitting: Neurology

## 2020-01-23 ENCOUNTER — Ambulatory Visit (HOSPITAL_COMMUNITY)
Admission: RE | Admit: 2020-01-23 | Discharge: 2020-01-23 | Disposition: A | Discharge status: Home / Self Care | Payer: Medicare Other | Source: Ambulatory Visit | Attending: Neurology | Admitting: Neurology

## 2020-01-23 DIAGNOSIS — G459 Transient cerebral ischemic attack, unspecified: Secondary | ICD-10-CM | POA: Diagnosis not present

## 2020-01-23 LAB — B. BURGDORFI ANTIBODIES

## 2020-01-23 NOTE — Telephone Encounter (Signed)
I called the patient.  The carotid Doppler study was unremarkable.  Blood work so far is unremarkable exception that the ANA is positive but the antibody panel shows a minimal elevation of the SSB antibody and a mild elevation of the RNP antibody test, this likely is not related to any clinical issue.  The Lyme antibody panel is pending.    Carotid doppler 01/23/20:  Summary: Right Carotid: Velocities in the right ICA are consistent with a 1-39% stenosis.  Left Carotid: Velocities in the left ICA are consistent with a 1-39% stenosis.  Vertebrals:  Bilateral vertebral arteries demonstrate antegrade flow. Subclavians: Normal flow hemodynamics were seen in bilateral subclavian              arteries.

## 2020-01-24 ENCOUNTER — Telehealth: Payer: Self-pay | Admitting: Neurology

## 2020-01-24 MED ORDER — GABAPENTIN 100 MG PO CAPS: 100.0000 mg | ORAL_CAPSULE | Freq: Three times a day (TID) | ORAL | 3 refills | Status: DC

## 2020-01-24 NOTE — Telephone Encounter (Signed)
I called the patient.  The patient had an episode of feeling tremulous and shaky this morning, he oftentimes will get these spells after using the bathroom in the morning.  He checked his blood pressure around this time and it was unremarkable.  He has had a very extensive blood work-up including a work-up for pheochromocytoma.  Carotid Doppler study is unremarkable.  He tried hydroxyzine with minimal benefit, I will go ahead and put him on low-dose gabapentin as we had previously discussed.

## 2020-01-24 NOTE — Telephone Encounter (Signed)
Pt states that he is still feeling shaky and weak in the am after waking up. Pt states that he feels it during the day sometimes but the morning is the worst. Please advise.

## 2020-01-24 NOTE — Telephone Encounter (Signed)
Patient states he is drinking plenty of water and is not diabetic.  He went to the Emergency Room 11/14, 12/2 and the 12/5 for same c/o of weakness and shaking, said he was told all 3 times that there was nothing wrong and sent him home.  These symptoms have been going on for 30 days, cardiologist has been changing around his blood pressure meds this past year.  The last time his medicine was changed was losartan to amlodipine HCT and that was 12/25/19.

## 2020-01-24 NOTE — Addendum Note (Signed)
Addended by: Kathrynn Ducking on: 01/24/2020 05:14 PM   Modules accepted: Orders

## 2020-01-29 ENCOUNTER — Ambulatory Visit: Payer: Medicare Other

## 2020-01-29 NOTE — Addendum Note (Signed)
Addended by: Harrington Challenger on: 01/29/2020 07:33 AM   Modules accepted: Orders

## 2020-01-30 LAB — VITAMIN B12: Vitamin B-12: 959 pg/mL (ref 232–1245)

## 2020-01-30 LAB — LYME, WESTERN BLOT, SERUM (REFLEXED)
IgG P18 Ab.: ABSENT
IgG P23 Ab.: ABSENT
IgG P28 Ab.: ABSENT
IgG P30 Ab.: ABSENT
IgG P39 Ab.: ABSENT
IgG P41 Ab.: ABSENT
IgG P66 Ab.: ABSENT
IgG P93 Ab.: ABSENT
IgM P23 Ab.: ABSENT
IgM P39 Ab.: ABSENT
IgM P41 Ab.: ABSENT
Lyme IgG Wb: NEGATIVE
Lyme IgM Wb: NEGATIVE

## 2020-01-30 LAB — COPPER, SERUM: Copper: 94 ug/dL (ref 69–132)

## 2020-01-30 LAB — ENA+DNA/DS+SJORGEN'S
ENA RNP Ab: 4.1 AI — ABNORMAL HIGH (ref 0.0–0.9)
ENA SM Ab Ser-aCnc: <0.2 AI (ref 0.0–0.9)
ENA SSA (RO) Ab: <0.2 AI (ref 0.0–0.9)
ENA SSB (LA) Ab: 1 AI — ABNORMAL HIGH (ref 0.0–0.9)
dsDNA Ab: <1 IU/mL (ref 0–9)

## 2020-01-30 LAB — B. BURGDORFI ANTIBODIES: Lyme IgG/IgM Ab: 0.97 {ISR} — ABNORMAL HIGH (ref 0.00–0.90)

## 2020-01-30 LAB — SEDIMENTATION RATE: Sed Rate: 13 mm/hr (ref 0–30)

## 2020-01-30 LAB — ANA W/REFLEX: Anti Nuclear Antibody (ANA): POSITIVE — AB

## 2020-01-30 LAB — ANGIOTENSIN CONVERTING ENZYME: Angio Convert Enzyme: 42 U/L (ref 14–82)

## 2020-01-31 ENCOUNTER — Other Ambulatory Visit: Payer: Medicare Other

## 2020-01-31 ENCOUNTER — Ambulatory Visit (INDEPENDENT_AMBULATORY_CARE_PROVIDER_SITE_OTHER): Payer: Medicare Other | Admitting: Pharmacist

## 2020-01-31 ENCOUNTER — Encounter (HOSPITAL_COMMUNITY): Payer: Self-pay

## 2020-01-31 ENCOUNTER — Emergency Department (HOSPITAL_COMMUNITY): Payer: Medicare Other

## 2020-01-31 ENCOUNTER — Emergency Department (HOSPITAL_COMMUNITY)
Admission: EM | Admit: 2020-01-31 | Discharge: 2020-01-31 | Disposition: A | Discharge status: Home / Self Care | Payer: Medicare Other | Attending: Emergency Medicine | Admitting: Emergency Medicine

## 2020-01-31 ENCOUNTER — Other Ambulatory Visit: Payer: Self-pay | Admitting: Cardiology

## 2020-01-31 ENCOUNTER — Other Ambulatory Visit: Payer: Self-pay

## 2020-01-31 VITALS — BP 126/74

## 2020-01-31 DIAGNOSIS — R002 Palpitations: Secondary | ICD-10-CM | POA: Insufficient documentation

## 2020-01-31 DIAGNOSIS — Z79899 Other long term (current) drug therapy: Secondary | ICD-10-CM | POA: Diagnosis not present

## 2020-01-31 DIAGNOSIS — R531 Weakness: Secondary | ICD-10-CM | POA: Diagnosis not present

## 2020-01-31 DIAGNOSIS — R55 Syncope and collapse: Secondary | ICD-10-CM | POA: Diagnosis not present

## 2020-01-31 DIAGNOSIS — E119 Type 2 diabetes mellitus without complications: Secondary | ICD-10-CM | POA: Insufficient documentation

## 2020-01-31 DIAGNOSIS — I10 Essential (primary) hypertension: Secondary | ICD-10-CM

## 2020-01-31 DIAGNOSIS — R0989 Other specified symptoms and signs involving the circulatory and respiratory systems: Secondary | ICD-10-CM

## 2020-01-31 DIAGNOSIS — R079 Chest pain, unspecified: Secondary | ICD-10-CM | POA: Diagnosis present

## 2020-01-31 DIAGNOSIS — E876 Hypokalemia: Secondary | ICD-10-CM

## 2020-01-31 LAB — CBC
HCT: 38.8 % — ABNORMAL LOW (ref 39.0–52.0)
Hemoglobin: 13.4 g/dL (ref 13.0–17.0)
MCH: 32.1 pg (ref 26.0–34.0)
MCHC: 34.5 g/dL (ref 30.0–36.0)
MCV: 93 fL (ref 80.0–100.0)
Platelets: 194 10*3/uL (ref 150–400)
RBC: 4.17 MIL/uL — ABNORMAL LOW (ref 4.22–5.81)
RDW: 12.6 % (ref 11.5–15.5)
WBC: 4.3 10*3/uL (ref 4.0–10.5)
nRBC: 0 % (ref 0.0–0.2)

## 2020-01-31 LAB — HEPATIC FUNCTION PANEL
ALT: 39 U/L (ref 0–44)
AST: 31 U/L (ref 15–41)
Albumin: 3.8 g/dL (ref 3.5–5.0)
Alkaline Phosphatase: 55 U/L (ref 38–126)
Bilirubin, Direct: 0.1 mg/dL (ref 0.0–0.2)
Indirect Bilirubin: 0.6 mg/dL (ref 0.3–0.9)
Total Bilirubin: 0.7 mg/dL (ref 0.3–1.2)
Total Protein: 7.2 g/dL (ref 6.5–8.1)

## 2020-01-31 LAB — URINALYSIS, ROUTINE W REFLEX MICROSCOPIC
Bilirubin Urine: NEGATIVE
Glucose, UA: NEGATIVE mg/dL
Hgb urine dipstick: NEGATIVE
Ketones, ur: NEGATIVE mg/dL
Leukocytes,Ua: NEGATIVE
Nitrite: NEGATIVE
Protein, ur: NEGATIVE mg/dL
Specific Gravity, Urine: 1.005 (ref 1.005–1.030)
pH: 6 (ref 5.0–8.0)

## 2020-01-31 LAB — BASIC METABOLIC PANEL
Anion gap: 10 (ref 5–15)
BUN: 14 mg/dL (ref 8–23)
CO2: 27 mmol/L (ref 22–32)
Calcium: 8.9 mg/dL (ref 8.9–10.3)
Chloride: 101 mmol/L (ref 98–111)
Creatinine, Ser: 1.05 mg/dL (ref 0.61–1.24)
GFR, Estimated: >60 mL/min (ref >60)
Glucose, Bld: 108 mg/dL — ABNORMAL HIGH (ref 70–99)
Potassium: 3.3 mmol/L — ABNORMAL LOW (ref 3.5–5.1)
Sodium: 138 mmol/L (ref 135–145)

## 2020-01-31 LAB — MAGNESIUM: Magnesium: 2.1 mg/dL (ref 1.7–2.4)

## 2020-01-31 LAB — TROPONIN I (HIGH SENSITIVITY): Troponin I (High Sensitivity): 9 ng/L (ref <18)

## 2020-01-31 LAB — TSH: TSH: 0.73 u[IU]/mL (ref 0.350–4.500)

## 2020-01-31 LAB — CBG MONITORING, ED: Glucose-Capillary: 96 mg/dL (ref 70–99)

## 2020-01-31 MED ORDER — SODIUM CHLORIDE 0.9 % IV SOLN: INTRAVENOUS | Status: DC

## 2020-01-31 MED ORDER — VALSARTAN 160 MG PO TABS: 160.0000 mg | ORAL_TABLET | Freq: Two times a day (BID) | ORAL | 1 refills | Status: DC

## 2020-01-31 MED ORDER — POTASSIUM CHLORIDE CRYS ER 20 MEQ PO TBCR
40.0000 meq | EXTENDED_RELEASE_TABLET | Freq: Once | ORAL | Status: AC
  Administered 2020-01-31: 40 meq via ORAL
  Filled 2020-01-31: qty 2

## 2020-01-31 NOTE — Patient Instructions (Addendum)
Return for a  follow up appointment as soon as possible with Dr. Oval Linsey  Check your blood pressure at home daily (if able) and keep record of the readings.  Take your BP meds as follows: *Change valsartan to 160mg  twice daily*  Bring all of your meds, your BP cuff and your record of home blood pressures to your next appointment.  Exercise as you're able, try to walk approximately 30 minutes per day.  Keep salt intake to a minimum, especially watch canned and prepared boxed foods.  Eat more fresh fruits and vegetables and fewer canned items.  Avoid eating in fast food restaurants.    HOW TO TAKE YOUR BLOOD PRESSURE: . Rest 5 minutes before taking your blood pressure. .  Don't smoke or drink caffeinated beverages for at least 30 minutes before. . Take your blood pressure before (not after) you eat. . Sit comfortably with your back supported and both feet on the floor (don't cross your legs). . Elevate your arm to heart level on a table or a desk. . Use the proper sized cuff. It should fit smoothly and snugly around your bare upper arm. There should be enough room to slip a fingertip under the cuff. The bottom edge of the cuff should be 1 inch above the crease of the elbow. . Ideally, take 3 measurements at one sitting and record the average.

## 2020-01-31 NOTE — ED Notes (Signed)
Per PA. PO challenge patient. Patient given crackers and water. No N/V at this time.

## 2020-01-31 NOTE — Discharge Instructions (Addendum)
Dr. Blenda Mounts office will send you a monitor to wear to evaluate the palpitations.  They will also call you with a sooner appointment.  Keep your appointment with the pharmacist today.

## 2020-01-31 NOTE — ED Triage Notes (Signed)
Pt arrived via walk in, c/o chest pain, high blood pressure, near syncope today. States he has been having these issues on going for "awhile" with no results.

## 2020-01-31 NOTE — ED Provider Notes (Signed)
[facility] DEPT Provider Note   CSN: [id number] Arrival date & time: 01/31/20  1024     History No chief complaint on file.   Douglas Proctor is a 79 y.o. male.  Pt presents to the ED today with cp, high blood pressure, near-syncope, and palpitations.  Pt said he's had these sx for a few months.  He has had a very big work up and everything, so far, has been negative.  Pt has been on several different bp meds to try to manage his bp.  He said his bp spikes; he feels like he is going to pass out; then is left feeling very weak.  When this happens, he also feels like he has palpitations.  He has not had a Zio or Holter monitor yet.  He felt like he was going to pass out this am and felt the palpitations, cp and weak.  He took his bp meds and is doing better now although he still feels weak.        Past Medical History:  Diagnosis Date  . Constipation   . Erectile dysfunction   . Essential hypertension 04/27/2019  . Murmur 12/11/2019  . Pure hypercholesterolemia 04/27/2019    Patient Active Problem List   Diagnosis Date Noted  . Murmur 12/11/2019  . Pure hypercholesterolemia 04/27/2019  . Essential hypertension 04/27/2019  . 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 HLD (hyperlipidemia) 08/12/2013  . LBP (low back pain) 08/12/2013  . B12 deficiency 08/12/2013    Past Surgical History:  Procedure Laterality Date  . FOOT SURGERY Left    broken toe, bunion removed  . SHOULDER SURGERY Right        Family History  Problem Relation Age of Onset  . Other Mother        hemorrhage - pt was 12 years old  . CAD Father     Social History   Tobacco Use  . Smoking status: Never Smoker  . Smokeless tobacco: Never Used  Substance Use Topics  . Alcohol use: Yes    Comment: one glass of wine each night, occasional beer  . Drug use: Never    Home Medications Prior to Admission medications   Medication Sig Start Date End Date Taking?  Authorizing Provider  amLODipine (NORVASC) 10 MG tablet Take 10 mg by mouth daily.    [provider]  ascorbic acid (VITAMIN C) 500 MG/5ML syrup Take by mouth daily.    [provider]  atorvastatin (LIPITOR) 10 MG tablet Take 10 mg by mouth daily.    [provider]  Chlorpheniramine-Acetaminophen (CORICIDIN HBP COLD/FLU PO) Take by mouth as needed.     [provider]  Cholecalciferol 25 MCG (1000 UT) tablet Take 2,000 Units by mouth.     [provider]  Fexofenadine HCl (MUCINEX ALLERGY PO) Take by mouth as needed.     [provider]  gabapentin (NEURONTIN) 100 MG capsule Take 1 capsule (100 mg total) by mouth 3 (three) times daily. 01/24/20   Kathrynn Ducking, MD  hydrochlorothiazide (HYDRODIURIL) 25 MG tablet Take 25 mg by mouth daily.    [provider]  Magnesium Hydroxide (MILK OF MAGNESIA PO) Take by mouth daily as needed.    [provider]  Multiple Vitamin (MULTIVITAMIN ADULT PO) Take by mouth.    [provider]  polyethylene glycol (MIRALAX / GLYCOLAX) 17 g packet Take 17 g by mouth in the morning, at noon, and at bedtime.  [provider]  senna (SENOKOT) 8.6 MG tablet Take 2 tablets once daily as needed for constipation 01/05/20   [provider]  sildenafil (VIAGRA) 50 MG tablet Take 1/2-1 tab prior to sexual activity 06/10/19   [provider]  valsartan (DIOVAN) 320 MG tablet Take 320 mg by mouth daily.    [provider]  XALATAN 0.005 % ophthalmic solution Place 1 drop into both eyes at bedtime.  05/07/15   [provider]    Allergies    Clonidine derivatives, Lisinopril, Other, Oysters [shellfish allergy], and Spironolactone  Review of Systems   Review of Systems  Cardiovascular: Positive for chest pain and palpitations.  Neurological: Positive for weakness.  All other systems reviewed and are negative.   Physical Exam Updated Vital  Signs BP 121/82   Pulse 74   Temp 97.7 F (36.5 C) (Oral)   Resp 17   SpO2 96%   Physical Exam Vitals and nursing note reviewed.  Constitutional:      Appearance: Normal appearance.  HENT:     Head: Normocephalic and atraumatic.     Right Ear: External ear normal.     Left Ear: External ear normal.     Nose: Nose normal.     Mouth/Throat:     Mouth: Mucous membranes are moist.     Pharynx: Oropharynx is clear.  Eyes:     Extraocular Movements: Extraocular movements intact.     Conjunctiva/sclera: Conjunctivae normal.     Pupils: Pupils are equal, round, and reactive to light.  Cardiovascular:     Rate and Rhythm: Normal rate and regular rhythm.     Pulses: Normal pulses.     Heart sounds: Normal heart sounds.  Pulmonary:     Effort: Pulmonary effort is normal.     Breath sounds: Normal breath sounds.  Abdominal:     General: Abdomen is flat. Bowel sounds are normal.     Palpations: Abdomen is soft.  Musculoskeletal:        General: Normal range of motion.     Cervical back: Normal range of motion and neck supple.  Skin:    General: Skin is warm.     Capillary Refill: Capillary refill takes less than 2 seconds.  Neurological:     General: No focal deficit present.     Mental Status: He is alert and oriented to person, place, and time.  Psychiatric:        Mood and Affect: Mood normal.        Behavior: Behavior normal.        Thought Content: Thought content normal.        Judgment: Judgment normal.     ED Results / Procedures / Treatments   Labs (all labs ordered are listed, but only abnormal results are displayed) Labs Reviewed  BASIC METABOLIC PANEL - Abnormal; Notable for the following components:      Result Value   Potassium 3.3 (*)    Glucose, Bld 108 (*)    All other components within normal limits  CBC - Abnormal; Notable for the following components:   RBC 4.17 (*)    HCT 38.8 (*)    All other components within normal limits  URINALYSIS, ROUTINE  W REFLEX MICROSCOPIC - Abnormal; Notable for the following components:   Color, Urine STRAW (*)    All other components within normal limits  HEPATIC FUNCTION PANEL  MAGNESIUM  TSH  CBG MONITORING, ED  TROPONIN I (HIGH SENSITIVITY)  TROPONIN I (HIGH SENSITIVITY)    EKG EKG Interpretation  Date/Time:  Thursday January 31 2020 10:34:56 EST Ventricular Rate:  88 PR Interval:    QRS Duration: 106 QT Interval:  392 QTC Calculation: 475 R Axis:   0 Text Interpretation: Sinus rhythm Abnormal R-wave progression, early transition Left ventricular hypertrophy Anterior Q waves, possibly due to LVH 12 Lead; Mason-Likar No significant change since last tracing Confirmed by Isla Pence [phone]) on 01/31/2020 10:42:00 AM   Radiology DG Chest 2 View  Result Date: 01/31/2020 CLINICAL DATA:  Chest pain and elevated broad pressure.  Syncope EXAM: CHEST - 2 VIEW COMPARISON:  01/17/2020 FINDINGS: Stable cardiomediastinal contours. Decreased lung volumes. No pleural effusion or edema. No airspace densities identified. Visualized osseous structures appear intact. IMPRESSION: 1. No acute cardiopulmonary abnormalities. Electronically Signed   By: Kerby Moors M.D.   On: 01/31/2020 11:27   CT Head Wo Contrast  Result Date: 01/31/2020 CLINICAL DATA:  Altered mental status and near syncopal event EXAM: CT HEAD WITHOUT CONTRAST TECHNIQUE: Contiguous axial images were obtained from the base of the skull through the vertex without intravenous contrast. COMPARISON:  12/30/2019 FINDINGS: Brain: No evidence of acute infarction, hemorrhage, hydrocephalus, extra-axial collection or mass lesion/mass effect. Mild basal ganglia calcifications are noted. Vascular: No hyperdense vessel or unexpected calcification. Skull: Normal. Negative for fracture or focal lesion. Sinuses/Orbits: No acute finding. Other: None. IMPRESSION: No acute intracranial abnormality noted. Electronically Signed   By: Inez Catalina M.D.   On:  01/31/2020 11:58    Procedures Procedures (including critical care time)  Medications Ordered in ED Medications  0.9 %  sodium chloride infusion ( Intravenous New Bag/Given 01/31/20 1152)  potassium chloride SA (KLOR-CON) CR tablet 40 mEq (has no administration in time range)    ED Course  I have reviewed the triage vital signs and the nursing notes.  Pertinent labs & imaging results that were available during my care of the patient were reviewed by me and considered in my medical decision making (see chart for details).    MDM Rules/Calculators/A&P                          Pt is now excellent.  It is 121/82.  He said he has most of his trouble in the am before he takes his meds.  ? Taking a longer acting med or some at night and some during the morning.  He has an appointment with the pharmacist today at 1500 and will talk about that with the pharmacist.  He was d/w cards on call who will send pt a monitor because of the palpitations and move his appt with Dr. Oval Linsey up.  Work up reassuring today.  Pt's K is slightly low today.  It is normally normal, but he's not been eating his banana b/c he had a colonoscopy.  He will be given 1 dose of kdur prior to d/c, and told to go back on the banana.   He is feeling well now and is stable for d/c.    Return if worse. Final Clinical Impression(s) / ED Diagnoses Final diagnoses:  Labile hypertension  Hypokalemia  Near syncope    Rx / DC Orders ED Discharge Orders    None       Isla Pence, MD 01/31/20 1233

## 2020-01-31 NOTE — Progress Notes (Signed)
Patient ID: Douglas Proctor                 DOB: 11/16/1940                      MRN: [id number]     HPI: Douglas Proctor is a 79 y.o. male referred by Dr. Oval Linsey to HTN clinic. PMH includes hypertension, pre-diabetes, hyperlipidemia, and low back pain. Patient presents today after visit to ED this morning for an episode of elevated BP, shakiness, tachycardia,and weakness.  No medication changes made in ER. Patient reports good BP readings at home with 2-3 episodes of extremely elevated BP, palpations, and weakness in the last month. His BP is usually high in the mornings, but well controlled during the day, after medication administration.  Current HTN meds:  Amlodipine 10mg  daily HCTZ 25mg  daily Valsartan 320mg  daily  Previously tried:  Lisinopril - angioedema Clonidine - ADR felt sick Carvedilol - ineffective Hydralazine    BP goal: 130/80  Family History: The patient's family history includes Bradycardia in his sister; CAD in his father.   Social History: 1 glass of wine per week, denies tobacco use  Diet: mostly home cooked meals with limited sodium inatek  Exercise: stationary recumbent bike 3-4 times per week for 30 minutes at a time.  Home BP readings: no records provided  Wt Readings from Last 3 Encounters:  02/07/20 248 lb (112.5 kg)  01/21/20 249 lb (112.9 kg)  01/20/20 250 lb (113.4 kg)   BP Readings from Last 3 Encounters:  02/07/20 132/81  01/31/20 126/74  01/31/20 (!) 130/92   Pulse Readings from Last 3 Encounters:  02/07/20 76  01/31/20 65  01/21/20 77    Past Medical History:  Diagnosis Date  . Constipation   . Erectile dysfunction   . Essential hypertension 04/27/2019  . Murmur 12/11/2019  . Pure hypercholesterolemia 04/27/2019  . 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    Current Outpatient Medications on File Prior to Visit  Medication Sig Dispense Refill  . amLODipine (NORVASC) 10 MG tablet Take 10 mg by  mouth daily.    Marland Kitchen ascorbic acid (VITAMIN C) 500 MG/5ML syrup Take by mouth daily.    Marland Kitchen atorvastatin (LIPITOR) 10 MG tablet Take 10 mg by mouth daily.    . Chlorpheniramine-Acetaminophen (CORICIDIN HBP COLD/FLU PO) Take by mouth as needed.     . Cholecalciferol 25 MCG (1000 UT) tablet Take 2,000 Units by mouth.     . Fexofenadine HCl (MUCINEX ALLERGY PO) Take by mouth as needed.     . gabapentin (NEURONTIN) 100 MG capsule Take 1 capsule (100 mg total) by mouth 3 (three) times daily. 90 capsule 3  . hydrochlorothiazide (HYDRODIURIL) 25 MG tablet Take 25 mg by mouth daily.    . Magnesium Hydroxide (MILK OF MAGNESIA PO) Take by mouth daily as needed.    . Multiple Vitamin (MULTIVITAMIN ADULT PO) Take by mouth.    . polyethylene glycol (MIRALAX / GLYCOLAX) 17 g packet Take 17 g by mouth in the morning, at noon, and at bedtime.    . senna (SENOKOT) 8.6 MG tablet Take 2 tablets once daily as needed for constipation    . sildenafil (VIAGRA) 50 MG tablet Take 1/2-1 tab prior to sexual activity    . XALATAN 0.005 % ophthalmic solution Place 1 drop into both eyes at bedtime.      No current facility-administered medications on file  prior to visit.    Allergies  Allergen Reactions  . Clonidine Derivatives     FELT SICK AND STATED FELT LIKE SPIDER WEBS ON HIM  . Lisinopril Swelling    Lips and face swelling  . Other     Cats and dogs dander   . Oysters [Shellfish Allergy] Other (See Comments)    Unknown exact reaction- was tested  . Spironolactone     Swelling in lips/face     Blood pressure 126/74.  Essential hypertension Blood pressure at goal today. Cardiac monitor ordered and to be placed ASAP. Noted BP well controlled during daytime and elevated late at night and early mornings. Will change valsartan to 160mg  BID instead of 320mg  daily. All other medication as previously prescribed.  We also discussed how to monitor pulse and detect "skip eats" or "pauses" in rhythm.   Patient was  encouraged to follow up ASAP with Dr. Oval Linsey and call if additional problems noted.  Douglas Proctor PharmD, BCPS, [place] [id number] 02/11/2020 4:10 PM

## 2020-02-04 ENCOUNTER — Other Ambulatory Visit: Payer: Self-pay | Admitting: *Deleted

## 2020-02-04 ENCOUNTER — Ambulatory Visit (INDEPENDENT_AMBULATORY_CARE_PROVIDER_SITE_OTHER): Payer: Medicare Other

## 2020-02-04 DIAGNOSIS — R55 Syncope and collapse: Secondary | ICD-10-CM

## 2020-02-04 DIAGNOSIS — R002 Palpitations: Secondary | ICD-10-CM

## 2020-02-05 ENCOUNTER — Other Ambulatory Visit (HOSPITAL_COMMUNITY): Payer: Self-pay | Admitting: Gastroenterology

## 2020-02-05 ENCOUNTER — Telehealth: Payer: Self-pay | Admitting: Cardiovascular Disease

## 2020-02-05 ENCOUNTER — Other Ambulatory Visit: Payer: Self-pay | Admitting: Gastroenterology

## 2020-02-05 DIAGNOSIS — R1011 Right upper quadrant pain: Secondary | ICD-10-CM

## 2020-02-05 NOTE — Telephone Encounter (Signed)
Returned call to patient and patients wife. Patient is having a procedure next week and will be unable to wear during the procedure. They will wait till after to apply so patient can get his full 14 days.

## 2020-02-05 NOTE — Telephone Encounter (Signed)
° ° °  Pt's wife calling, she said she wanted to speak with [facility] regarding pt's heart monitor. Pt is having a procedure and may interfere him wearing the heart monitor

## 2020-02-06 ENCOUNTER — Other Ambulatory Visit (HOSPITAL_COMMUNITY): Payer: Self-pay | Admitting: Gastroenterology

## 2020-02-06 DIAGNOSIS — R1011 Right upper quadrant pain: Secondary | ICD-10-CM

## 2020-02-07 ENCOUNTER — Encounter (HOSPITAL_COMMUNITY): Payer: Self-pay

## 2020-02-07 ENCOUNTER — Emergency Department (HOSPITAL_COMMUNITY)
Admission: EM | Admit: 2020-02-07 | Discharge: 2020-02-07 | Disposition: A | Discharge status: Home / Self Care | Payer: Medicare Other | Attending: Emergency Medicine | Admitting: Emergency Medicine

## 2020-02-07 ENCOUNTER — Emergency Department (HOSPITAL_COMMUNITY): Payer: Medicare Other

## 2020-02-07 ENCOUNTER — Other Ambulatory Visit: Payer: Self-pay

## 2020-02-07 DIAGNOSIS — Z79899 Other long term (current) drug therapy: Secondary | ICD-10-CM | POA: Diagnosis not present

## 2020-02-07 DIAGNOSIS — I10 Essential (primary) hypertension: Secondary | ICD-10-CM | POA: Insufficient documentation

## 2020-02-07 DIAGNOSIS — R55 Syncope and collapse: Secondary | ICD-10-CM | POA: Diagnosis not present

## 2020-02-07 DIAGNOSIS — R000 Tachycardia, unspecified: Secondary | ICD-10-CM | POA: Diagnosis not present

## 2020-02-07 DIAGNOSIS — R1011 Right upper quadrant pain: Secondary | ICD-10-CM | POA: Diagnosis present

## 2020-02-07 LAB — URINALYSIS, ROUTINE W REFLEX MICROSCOPIC
Bilirubin Urine: NEGATIVE
Glucose, UA: NEGATIVE mg/dL
Hgb urine dipstick: NEGATIVE
Ketones, ur: NEGATIVE mg/dL
Leukocytes,Ua: NEGATIVE
Nitrite: NEGATIVE
Protein, ur: NEGATIVE mg/dL
Specific Gravity, Urine: 1.004 — ABNORMAL LOW (ref 1.005–1.030)
pH: 7 (ref 5.0–8.0)

## 2020-02-07 LAB — HEPATIC FUNCTION PANEL
ALT: 33 U/L (ref 0–44)
AST: 33 U/L (ref 15–41)
Albumin: 4 g/dL (ref 3.5–5.0)
Alkaline Phosphatase: 57 U/L (ref 38–126)
Bilirubin, Direct: <0.1 mg/dL (ref 0.0–0.2)
Total Bilirubin: 0.5 mg/dL (ref 0.3–1.2)
Total Protein: 7.6 g/dL (ref 6.5–8.1)

## 2020-02-07 LAB — CBC
HCT: 42.9 % (ref 39.0–52.0)
Hemoglobin: 14.4 g/dL (ref 13.0–17.0)
MCH: 31.6 pg (ref 26.0–34.0)
MCHC: 33.6 g/dL (ref 30.0–36.0)
MCV: 94.1 fL (ref 80.0–100.0)
Platelets: 232 10*3/uL (ref 150–400)
RBC: 4.56 MIL/uL (ref 4.22–5.81)
RDW: 12.6 % (ref 11.5–15.5)
WBC: 3.9 10*3/uL — ABNORMAL LOW (ref 4.0–10.5)
nRBC: 0 % (ref 0.0–0.2)

## 2020-02-07 LAB — BASIC METABOLIC PANEL
Anion gap: 11 (ref 5–15)
BUN: 10 mg/dL (ref 8–23)
CO2: 26 mmol/L (ref 22–32)
Calcium: 9.4 mg/dL (ref 8.9–10.3)
Chloride: 105 mmol/L (ref 98–111)
Creatinine, Ser: 1.05 mg/dL (ref 0.61–1.24)
GFR, Estimated: >60 mL/min (ref >60)
Glucose, Bld: 111 mg/dL — ABNORMAL HIGH (ref 70–99)
Potassium: 3.5 mmol/L (ref 3.5–5.1)
Sodium: 142 mmol/L (ref 135–145)

## 2020-02-07 LAB — CBG MONITORING, ED: Glucose-Capillary: 100 mg/dL — ABNORMAL HIGH (ref 70–99)

## 2020-02-07 LAB — LIPASE, BLOOD: Lipase: 23 U/L (ref 11–51)

## 2020-02-07 MED ORDER — SUCRALFATE 1 GM/10ML PO SUSP: 1.0000 g | Freq: Three times a day (TID) | ORAL | 0 refills | Status: DC

## 2020-02-07 NOTE — ED Provider Notes (Signed)
Patient care transferred to me.  Labs are unrevealing and the right upper quadrant ultrasound is benign.  He states that he has been placed on omeprazole and he often is getting pain after he is eating so I will try Carafate as well.  Will discharge to follow-up with Dr. Benson Norway as scheduled as well as with his PCP.   Sherwood Gambler, MD 02/07/20 [phone]

## 2020-02-07 NOTE — Discharge Instructions (Signed)
If you develop worsening, continued, or recurrent abdominal pain, uncontrolled vomiting, fever, chest or back pain, or any other new/concerning symptoms then return to the ER for evaluation.  

## 2020-02-07 NOTE — ED Provider Notes (Signed)
[facility] DEPT Provider Note   CSN: [id number] Arrival date & time: 02/07/20  1129     History Chief Complaint  Patient presents with  . Tachycardia  . Near Syncope  . Flank Pain    Douglas Proctor is a 79 y.o. male.  Patient presents with concern of abdominal pain, episodes of shakiness and tachycardia.  Patient states symptoms been off and on for the past month.  He is seen multiple doctors and ER visits with what he states was not a clear answer.  He has been seeing a GI physician who thinks he might be a gallbladder disease problem and is pending an ultrasound at home.  However he had recurrence of pain today the last about 30 minutes prior to arrival and would like to be evaluated in the ER.  He denies any pain at this time at rest.  He did have an episode of tachycardia which he captured on a portable cardiac monitor he had.        Past Medical History:  Diagnosis Date  . Constipation   . Erectile dysfunction   . Essential hypertension 04/27/2019  . Murmur 12/11/2019  . Pure hypercholesterolemia 04/27/2019    Patient Active Problem List   Diagnosis Date Noted  . Murmur 12/11/2019  . Pure hypercholesterolemia 04/27/2019  . Essential hypertension 04/27/2019  . 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 HLD (hyperlipidemia) 08/12/2013  . LBP (low back pain) 08/12/2013  . B12 deficiency 08/12/2013    Past Surgical History:  Procedure Laterality Date  . FOOT SURGERY Left    broken toe, bunion removed  . SHOULDER SURGERY Right        Family History  Problem Relation Age of Onset  . Other Mother        hemorrhage - pt was 71 years old  . CAD Father     Social History   Tobacco Use  . Smoking status: Never Smoker  . Smokeless tobacco: Never Used  Vaping Use  . Vaping Use: Never used  Substance Use Topics  . Alcohol use: Yes    Comment: one glass of wine each night, occasional beer  . Drug use: Never    Home  Medications Prior to Admission medications   Medication Sig Start Date End Date Taking? Authorizing Provider  amLODipine (NORVASC) 10 MG tablet Take 10 mg by mouth daily.    [provider]  ascorbic acid (VITAMIN C) 500 MG/5ML syrup Take by mouth daily.    [provider]  atorvastatin (LIPITOR) 10 MG tablet Take 10 mg by mouth daily.    [provider]  Chlorpheniramine-Acetaminophen (CORICIDIN HBP COLD/FLU PO) Take by mouth as needed.     [provider]  Cholecalciferol 25 MCG (1000 UT) tablet Take 2,000 Units by mouth.     [provider]  Fexofenadine HCl (MUCINEX ALLERGY PO) Take by mouth as needed.     [provider]  gabapentin (NEURONTIN) 100 MG capsule Take 1 capsule (100 mg total) by mouth 3 (three) times daily. 01/24/20   Kathrynn Ducking, MD  hydrochlorothiazide (HYDRODIURIL) 25 MG tablet Take 25 mg by mouth daily.    [provider]  Magnesium Hydroxide (MILK OF MAGNESIA PO) Take by mouth daily as needed.    [provider]  Multiple Vitamin (MULTIVITAMIN ADULT PO) Take by mouth.    [provider]  polyethylene glycol (MIRALAX / GLYCOLAX) 17 g packet Take 17 g by mouth in the  morning, at noon, and at bedtime.    [provider]  senna (SENOKOT) 8.6 MG tablet Take 2 tablets once daily as needed for constipation 01/05/20   [provider]  sildenafil (VIAGRA) 50 MG tablet Take 1/2-1 tab prior to sexual activity 06/10/19   [provider]  valsartan (DIOVAN) 160 MG tablet Take 1 tablet (160 mg total) by mouth 2 (two) times daily. 01/31/20   [name], MD  XALATAN 0.005 % ophthalmic solution Place 1 drop into both eyes at bedtime.  05/07/15   [provider]    Allergies    Clonidine derivatives, Lisinopril, Other, Oysters [shellfish allergy], and Spironolactone  Review of Systems   Review of Systems  Constitutional: Negative for fever.  HENT: Negative for  ear pain and sore throat.   Eyes: Negative for pain.  Respiratory: Negative for cough.   Cardiovascular: Negative for chest pain.  Gastrointestinal: Positive for abdominal pain.  Genitourinary: Negative for flank pain.  Musculoskeletal: Negative for back pain.  Skin: Negative for color change and rash.  Neurological: Negative for syncope.  All other systems reviewed and are negative.   Physical Exam Updated Vital Signs BP (!) 109/93   Pulse 67   Temp 97.7 F (36.5 C) (Oral)   Resp 17   Ht 6\' 5"  (1.956 m)   Wt 112.5 kg   SpO2 97%   BMI 29.41 kg/m   Physical Exam Constitutional:      General: He is not in acute distress.    Appearance: He is well-developed.  HENT:     Head: Normocephalic.     Mouth/Throat:     Mouth: Mucous membranes are moist.  Cardiovascular:     Rate and Rhythm: Normal rate.  Pulmonary:     Effort: Pulmonary effort is normal.  Abdominal:     Palpations: Abdomen is soft.  Musculoskeletal:     Right lower leg: No edema.     Left lower leg: No edema.  Skin:    General: Skin is warm.     Capillary Refill: Capillary refill takes less than 2 seconds.  Neurological:     General: No focal deficit present.     Mental Status: He is alert.     ED Results / Procedures / Treatments   Labs (all labs ordered are listed, but only abnormal results are displayed) Labs Reviewed  BASIC METABOLIC PANEL - Abnormal; Notable for the following components:      Result Value   Glucose, Bld 111 (*)    All other components within normal limits  CBC - Abnormal; Notable for the following components:   WBC 3.9 (*)    All other components within normal limits  URINALYSIS, ROUTINE W REFLEX MICROSCOPIC - Abnormal; Notable for the following components:   Color, Urine STRAW (*)    Specific Gravity, Urine 1.004 (*)    All other components within normal limits  CBG MONITORING, ED - Abnormal; Notable for the following components:   Glucose-Capillary 100 (*)    All other  components within normal limits  HEPATIC FUNCTION PANEL  LIPASE, BLOOD    EKG None  Radiology No results found.  Procedures Procedures (including critical care time)  Medications Ordered in ED Medications - No data to display  ED Course  I have reviewed the triage vital signs and the nursing notes.  Pertinent labs & imaging results that were available during my care of the patient were reviewed by me and considered in my medical decision  making (see chart for details).    MDM Rules/Calculators/A&P                          Currently there is no abdominal tenderness or guarding.  Clinically have low suspicion for acute cholecystitis given his exam.  Etiology of his intermittent pains and shakiness is unclear.  Work-up included lab work which is unremarkable.  Ultrasound pursued over the gallbladder pending final result.  Patient be signed out to oncoming provider.   Final Clinical Impression(s) / ED Diagnoses Final diagnoses:  RUQ pain    Rx / DC Orders ED Discharge Orders    None       Luna Fuse, MD 02/07/20 1447

## 2020-02-07 NOTE — ED Notes (Signed)
Patient went to the restroom.

## 2020-02-07 NOTE — ED Triage Notes (Signed)
Patient has the [employer]ia mobile App. Patient felt like his heart was racing and had a near syncopal episode. Patient states when episode ended he looked at his [employer] Mobile and heart rate 146 and lasted 30 seconds.

## 2020-02-08 ENCOUNTER — Encounter: Payer: Self-pay | Admitting: Cardiovascular Disease

## 2020-02-08 DIAGNOSIS — I701 Atherosclerosis of renal artery: Secondary | ICD-10-CM

## 2020-02-08 HISTORY — DX: Atherosclerosis of renal artery: I70.1

## 2020-02-11 ENCOUNTER — Other Ambulatory Visit: Payer: Self-pay

## 2020-02-11 ENCOUNTER — Ambulatory Visit (HOSPITAL_COMMUNITY): Payer: Medicare Other

## 2020-02-11 ENCOUNTER — Encounter: Payer: Self-pay | Admitting: Pharmacist

## 2020-02-11 DIAGNOSIS — I10 Essential (primary) hypertension: Secondary | ICD-10-CM

## 2020-02-11 DIAGNOSIS — Z79899 Other long term (current) drug therapy: Secondary | ICD-10-CM

## 2020-02-11 NOTE — Assessment & Plan Note (Signed)
Blood pressure at goal today. Cardiac monitor ordered and to be placed ASAP. Noted BP well controlled during daytime and elevated late at night and early mornings. Will change valsartan to 160mg  BID instead of 320mg  daily. All other medication as previously prescribed.  We also discussed how to monitor pulse and detect "skip eats" or "pauses" in rhythm.   Patient was encouraged to follow up ASAP with Dr. and call if additional problems noted.

## 2020-02-12 ENCOUNTER — Ambulatory Visit (HOSPITAL_COMMUNITY): Payer: Medicare Other

## 2020-02-12 ENCOUNTER — Encounter (HOSPITAL_COMMUNITY): Payer: Self-pay

## 2020-02-12 NOTE — Telephone Encounter (Signed)
It seems this patient was being tried on low-dose gabapentin by Dr. Anne Hahn.  I recommend if he is currently taking 100 mg 3 times daily to increase the dose to 200 mg 3 times daily is to see if it helps.  Have him call back Dr. Anne Hahn next week for further advice

## 2020-02-12 NOTE — Telephone Encounter (Signed)
Dr. Pearlean Brownie- do you have any further recommendations?

## 2020-02-12 NOTE — Telephone Encounter (Signed)
Pt called stating that he is still having the trembling and weakness in his limbs. Pt states that his BP is fine but yet is still having pressure and headaches. Pt is wanting to speak to someone to be advised because he has already been to the ED several times already. Please advise.

## 2020-02-14 ENCOUNTER — Encounter (HOSPITAL_COMMUNITY)
Admission: RE | Admit: 2020-02-14 | Discharge: 2020-02-14 | Disposition: A | Discharge status: Home / Self Care | Payer: Medicare Other | Source: Ambulatory Visit | Attending: Gastroenterology | Admitting: Gastroenterology

## 2020-02-14 ENCOUNTER — Other Ambulatory Visit: Payer: Self-pay

## 2020-02-14 ENCOUNTER — Telehealth: Payer: Self-pay | Admitting: Emergency Medicine

## 2020-02-14 DIAGNOSIS — R55 Syncope and collapse: Secondary | ICD-10-CM

## 2020-02-14 DIAGNOSIS — R002 Palpitations: Secondary | ICD-10-CM | POA: Diagnosis not present

## 2020-02-14 DIAGNOSIS — R1011 Right upper quadrant pain: Secondary | ICD-10-CM | POA: Diagnosis present

## 2020-02-14 MED ORDER — TECHNETIUM TC 99M MEBROFENIN IV KIT
5.0000 | PACK | Freq: Once | INTRAVENOUS | Status: AC | PRN
  Administered 2020-02-14: 5 via INTRAVENOUS

## 2020-02-14 NOTE — Telephone Encounter (Signed)
I would recommend use of Tylenol for intermittent headaches for now.  If he has any new onset of one-sided numbness or tingling or weakness or severe headache, he may have to go to the emergency room.  For now, I would not recommend any additional new prescriptions.  We can have Dr. Anne Hahn review for additional recommendations upon his return next week.

## 2020-02-14 NOTE — Telephone Encounter (Signed)
Called patient but did not reach him by phone.  LVM with Dr. Teofilo Pod recommendation and advisement of calling office back Monday to speak to Dr. Anne Hahn if he had any additional questions.

## 2020-02-14 NOTE — Telephone Encounter (Signed)
Called and LVM (on DPR) for patient with Dr.Sethi's recommendation to increase the gabapentin to 200 mg 3 times a day.  Asked patient to return my call if he had any questions and also Dr. Anne Hahn will return next week.

## 2020-02-14 NOTE — Telephone Encounter (Signed)
Patient called back and stated that he went to the ED and they took him off the gabapentin and felt that was part of the problem, however, patient is experiencing intermittent headaches, hot/cold flashes, tingingling in fingers.  Checks blood pressure is okay when having these episode.  Would like a provider to call him. [phone].

## 2020-02-15 MED ORDER — DULOXETINE HCL 30 MG PO CPEP: ORAL_CAPSULE | ORAL | 3 refills | Status: DC

## 2020-02-15 NOTE — Telephone Encounter (Signed)
I called the patient.  The patient is having some pressure sensations in the head, headache, lightheaded sensations, tingling in the fingers.  I am not sure this is all related to gabapentin but the patient believes it got worse on the medication.  He has stopped the drug.   I will start Cymbalta and low-dose, 30 mg daily for a week and then go to 30 mg twice daily.

## 2020-02-15 NOTE — Addendum Note (Signed)
Addended by: York Spaniel on: 02/15/2020 12:57 PM   Modules accepted: Orders

## 2020-03-02 ENCOUNTER — Encounter (HOSPITAL_COMMUNITY): Payer: Self-pay | Admitting: Emergency Medicine

## 2020-03-02 ENCOUNTER — Emergency Department (HOSPITAL_COMMUNITY)
Admission: EM | Admit: 2020-03-02 | Discharge: 2020-03-03 | Disposition: A | Discharge status: Home / Self Care | Payer: Medicare Other | Attending: Emergency Medicine | Admitting: Emergency Medicine

## 2020-03-02 DIAGNOSIS — I10 Essential (primary) hypertension: Secondary | ICD-10-CM | POA: Diagnosis not present

## 2020-03-02 DIAGNOSIS — Z79899 Other long term (current) drug therapy: Secondary | ICD-10-CM | POA: Diagnosis not present

## 2020-03-02 DIAGNOSIS — T7840XA Allergy, unspecified, initial encounter: Secondary | ICD-10-CM | POA: Diagnosis present

## 2020-03-02 DIAGNOSIS — L0100 Impetigo, unspecified: Secondary | ICD-10-CM | POA: Diagnosis not present

## 2020-03-02 DIAGNOSIS — L309 Dermatitis, unspecified: Secondary | ICD-10-CM | POA: Diagnosis not present

## 2020-03-02 MED ORDER — SULFAMETHOXAZOLE-TRIMETHOPRIM 800-160 MG PO TABS
1.0000 | ORAL_TABLET | Freq: Once | ORAL | Status: AC
  Administered 2020-03-03: 1 via ORAL
  Filled 2020-03-02: qty 1

## 2020-03-02 MED ORDER — SULFAMETHOXAZOLE-TRIMETHOPRIM 800-160 MG PO TABS: 1.0000 | ORAL_TABLET | Freq: Two times a day (BID) | ORAL | 0 refills | Status: AC

## 2020-03-02 MED ORDER — MUPIROCIN CALCIUM 2 % EX CREA
TOPICAL_CREAM | Freq: Once | CUTANEOUS | Status: AC
  Filled 2020-03-02: qty 15

## 2020-03-02 MED ORDER — MUPIROCIN CALCIUM 2 % EX CREA: 1.0000 "application " | TOPICAL_CREAM | Freq: Two times a day (BID) | CUTANEOUS | 0 refills | Status: DC

## 2020-03-02 NOTE — ED Provider Notes (Signed)
[facility] DEPT Provider Note   CSN: [id number] Arrival date & time: 03/02/20  2136     History Chief Complaint  Patient presents with  . Blistering  . Allergic Reaction    Douglas Proctor is a 80 y.o. male.  Patient to ED with rash that developed this morning over the scalp. He reports itching as well as pain. There is a reported area of blistering that is draining. He started a new medication (Keflex) 2 days ago for an eye infection. No other new medications. No lip or tongue swelling, throat tightness, SoB. He reports swelling to the left side of his face that he is most concerned about. He arrives via EMS who gave Benadryl 50 mg in route and has less itching but no change to the rash.   The history is provided by the patient. No language interpreter was used.  Allergic Reaction Presenting symptoms: rash   Presenting symptoms: no difficulty swallowing        Past Medical History:  Diagnosis Date  . Constipation   . Erectile dysfunction   . Essential hypertension 04/27/2019  . Murmur 12/11/2019  . Pure hypercholesterolemia 04/27/2019  . Renal artery stenosis ([place]) 02/08/2020   Mild stenosis on the left 12/2019.    Patient Active Problem List   Diagnosis Date Noted  . Renal artery stenosis ([place]) 02/08/2020  . Murmur 12/11/2019  . Pure hypercholesterolemia 04/27/2019  . Essential hypertension 04/27/2019  . 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 HLD (hyperlipidemia) 08/12/2013  . LBP (low back pain) 08/12/2013  . B12 deficiency 08/12/2013    Past Surgical History:  Procedure Laterality Date  . FOOT SURGERY Left    broken toe, bunion removed  . SHOULDER SURGERY Right        Family History  Problem Relation Age of Onset  . Other Mother        hemorrhage - pt was 66 years old  . CAD Father     Social History   Tobacco Use  . Smoking status: Never Smoker  . Smokeless tobacco: Never Used  Vaping Use  . Vaping Use: Never  used  Substance Use Topics  . Alcohol use: Yes    Comment: one glass of wine each night, occasional beer  . Drug use: Never    Home Medications Prior to Admission medications   Medication Sig Start Date End Date Taking? Authorizing Provider  amLODipine (NORVASC) 10 MG tablet Take 10 mg by mouth daily.    [provider]  ascorbic acid (VITAMIN C) 500 MG/5ML syrup Take by mouth daily.    [provider]  atorvastatin (LIPITOR) 10 MG tablet Take 10 mg by mouth daily.    [provider]  Chlorpheniramine-Acetaminophen (CORICIDIN HBP COLD/FLU PO) Take by mouth as needed.     [provider]  Cholecalciferol 25 MCG (1000 UT) tablet Take 2,000 Units by mouth.     [provider]  DULoxetine (CYMBALTA) 30 MG capsule 1 tablet daily for 1 week, then take 1 twice daily 02/15/20   Kathrynn Ducking, MD  Fexofenadine HCl [facility] ALLERGY PO) Take by mouth as needed.     [provider]  hydrochlorothiazide (HYDRODIURIL) 25 MG tablet Take 25 mg by mouth daily.    [provider]  Magnesium Hydroxide (MILK OF MAGNESIA PO) Take by mouth daily as needed.    [provider]  Multiple Vitamin (MULTIVITAMIN ADULT PO) Take by mouth.    [provider]  polyethylene glycol (MIRALAX / GLYCOLAX) 17 g packet Take 17 g by mouth in the morning, at noon, and at bedtime.    [provider]  senna (SENOKOT) 8.6 MG tablet Take 2 tablets once daily as needed for constipation 01/05/20   [provider]  sildenafil (VIAGRA) 50 MG tablet Take 1/2-1 tab prior to sexual activity 06/10/19   [provider]  sucralfate (CARAFATE) 1 GM/10ML suspension Take 10 mLs (1 g total) by mouth 4 (four) times daily -  with meals and at bedtime. 02/07/20   Sherwood Gambler, MD  valsartan (DIOVAN) 160 MG tablet Take 1 tablet (160 mg total) by mouth 2 (two) times daily. 01/31/20   [name], MD  XALATAN 0.005 % ophthalmic solution  Place 1 drop into both eyes at bedtime.  05/07/15   [provider]    Allergies    Clonidine derivatives, Lisinopril, Other, Oysters [shellfish allergy], and Spironolactone  Review of Systems   Review of Systems  HENT: Positive for facial swelling (Swelling to left pre-auricular face, no swelling of lip, tongue, oropharynx. ). Negative for trouble swallowing.   Respiratory: Negative for shortness of breath.   Gastrointestinal: Negative for abdominal pain.  Skin: Positive for rash.    Physical Exam Updated Vital Signs BP (!) 141/106 (BP Location: Right Arm)   Pulse 100   Temp 98.1 F (36.7 C) (Oral)   Resp 18   Ht 6\' 5"  (1.956 m)   Wt 109.8 kg   SpO2 96%   BMI 28.70 kg/m   Physical Exam Constitutional:      General: He is not in acute distress.    Appearance: He is well-developed and well-nourished.  HENT:     Head:     Comments: Area of mild swelling to left face over zygomatic prominence. No fluctuance, redness or blistering.     Mouth/Throat:     Mouth: Mucous membranes are moist.     Pharynx: Oropharynx is clear.     Comments: No swelling or lips, tongue or oropharynx. Cardiovascular:     Rate and Rhythm: Normal rate.  Pulmonary:     Effort: Pulmonary effort is normal.     Breath sounds: No stridor. No wheezing, rhonchi or rales.  Musculoskeletal:        General: Normal range of motion.     Cervical back: Normal range of motion.  Skin:    General: Skin is warm and dry.     Comments: Maculopapular rash to bilateral frontal and parietal scalp.There is a single blister on left parietal scalp with minimal drainage. Minimal redness.   Neurological:     Mental Status: He is alert and oriented to person, place, and time.  Psychiatric:        Mood and Affect: Mood and affect normal.     ED Results / Procedures / Treatments   Labs (all labs ordered are listed, but only abnormal results are displayed) Labs Reviewed - No data to  display  EKG None  Radiology No results found.  Procedures Procedures (including critical care time)  Medications Ordered in ED Medications - No data to display  ED Course  I have reviewed the triage vital signs and the nursing notes.  Pertinent labs & imaging results that were available during my care of the patient were reviewed by me and considered in my medical decision making (see chart for details).    MDM Rules/Calculators/A&P  Patient to ED with onset rash to scalp 2 days after starting Keflex for an eye infection.   Rash does not appear c/w hives or acute allergic reaction. Consider impetigo or skin infection.   Will stop Keflex, start Bactrim and mucopirocin. Recommend PCP follow up in 2-3 days.  Final Clinical Impression(s) / ED Diagnoses Final diagnoses:  None   1. Impetigo  Rx / DC Orders ED Discharge Orders    None       Dennie Bible 03/02/20 2252    Isla Pence, MD 03/02/20 2300

## 2020-03-02 NOTE — Discharge Instructions (Signed)
Stop using the Keflex antibiotic. Stop Acyclovir - this is not shingles. Start Septra DS (Bactrim) and Bactroban (topical) and use as directed. Please inform your ophthalmologist of the changes made to your treatment and follow up as he directs.

## 2020-03-02 NOTE — ED Triage Notes (Addendum)
Pt BIB [facility] from home. Pt reports that he was prescribed an antibiotic (Kephlex) on Friday and has started developing blisters on the top of his head and facial swelling starting this morning. 50mg  benadryl given en route. No difficulty swallowing, breathing, or tongue swelling noted. He was initially on the antibiotic for an "internal stye" on his L eye.

## 2020-03-03 ENCOUNTER — Emergency Department (HOSPITAL_COMMUNITY)
Admission: EM | Admit: 2020-03-03 | Discharge: 2020-03-03 | Disposition: A | Discharge status: Home / Self Care | Payer: Medicare Other | Source: Home / Self Care | Attending: Emergency Medicine | Admitting: Emergency Medicine

## 2020-03-03 ENCOUNTER — Encounter (HOSPITAL_COMMUNITY): Payer: Self-pay

## 2020-03-03 ENCOUNTER — Other Ambulatory Visit: Payer: Self-pay

## 2020-03-03 DIAGNOSIS — L309 Dermatitis, unspecified: Secondary | ICD-10-CM | POA: Insufficient documentation

## 2020-03-03 DIAGNOSIS — Z79899 Other long term (current) drug therapy: Secondary | ICD-10-CM | POA: Insufficient documentation

## 2020-03-03 DIAGNOSIS — L0100 Impetigo, unspecified: Secondary | ICD-10-CM | POA: Insufficient documentation

## 2020-03-03 DIAGNOSIS — I10 Essential (primary) hypertension: Secondary | ICD-10-CM | POA: Insufficient documentation

## 2020-03-03 MED ORDER — DOXYCYCLINE HYCLATE 100 MG PO TABS: 100.0000 mg | ORAL_TABLET | Freq: Two times a day (BID) | ORAL | 0 refills | Status: DC

## 2020-03-03 MED ORDER — PREDNISONE 20 MG PO TABS
40.0000 mg | ORAL_TABLET | Freq: Once | ORAL | Status: AC
  Administered 2020-03-03: 40 mg via ORAL
  Filled 2020-03-03: qty 2

## 2020-03-03 MED ORDER — PREDNISONE 10 MG PO TABS: ORAL_TABLET | ORAL | 0 refills | Status: AC

## 2020-03-03 NOTE — Discharge Instructions (Addendum)
Start taking the prednisone to see if that helps with the scalp swelling.  Continue the Bactrim antibiotic and the mupirocin ointment to the scalp.  If you are not noticing improvement in a couple of days discontinue the Bactrim and start taking doxycycline.  Consider seeing a dermatologist for further evaluation.  Take Tylenol as needed for pain and discomfort.  You can take over-the-counter antihistamine such as Claritin, Zyrtec or Benadryl for itching

## 2020-03-03 NOTE — ED Provider Notes (Signed)
[facility] DEPT Provider Note   CSN: [id number] Arrival date & time: 03/03/20  1539     History Chief Complaint  Patient presents with  . Facial Swelling  . Headache    Douglas Proctor is a 80 y.o. male.  HPI   Patient presents to the ED for evaluation of a rash on his scalp.  Patient started developing in the systems yesterday.  He noticed itching as well as pain.  He started to notice some pustular type drainage.  A couple days prior patient had been on Keflex for an eye infection.  His eye doctor also empirically started on acyclovir 1 he started describing the blister lesion.  Patient was started on Bactrim last night and he was also given a prescription for mupirocin.  Patient returned to the ED because he feels like is getting worse today.  He is not having any fevers.  It does continue to itch.  He has noticed more redness and swelling to his scalp.  The swelling has also gone down into his face although the rash has not.  Patient denies any new detergents.  No new lotions.  No new medications other than the ones discussed  Past Medical History:  Diagnosis Date  . Constipation   . Erectile dysfunction   . Essential hypertension 04/27/2019  . Murmur 12/11/2019  . Pure hypercholesterolemia 04/27/2019  . Renal artery stenosis ([place]) 02/08/2020   Mild stenosis on the left 12/2019.    Patient Active Problem List   Diagnosis Date Noted  . Renal artery stenosis ([place]) 02/08/2020  . Murmur 12/11/2019  . Pure hypercholesterolemia 04/27/2019  . Essential hypertension 04/27/2019  . 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 HLD (hyperlipidemia) 08/12/2013  . LBP (low back pain) 08/12/2013  . B12 deficiency 08/12/2013    Past Surgical History:  Procedure Laterality Date  . FOOT SURGERY Left    broken toe, bunion removed  . SHOULDER SURGERY Right        Family History  Problem Relation Age of Onset  . Other Mother        hemorrhage - pt was  68 years old  . CAD Father     Social History   Tobacco Use  . Smoking status: Never Smoker  . Smokeless tobacco: Never Used  Vaping Use  . Vaping Use: Never used  Substance Use Topics  . Alcohol use: Yes    Comment: one glass of wine each night, occasional beer  . Drug use: Never    Home Medications Prior to Admission medications   Medication Sig Start Date End Date Taking? Authorizing Provider  doxycycline (VIBRA-TABS) 100 MG tablet Take 1 tablet (100 mg total) by mouth 2 (two) times daily. 03/03/20  Yes Dorie Rank, MD  predniSONE (DELTASONE) 10 MG tablet Take 4 tablets (40 mg total) by mouth daily with breakfast for 3 days, THEN 3 tablets (30 mg total) daily with breakfast for 2 days, THEN 2 tablets (20 mg total) daily with breakfast for 1 day, THEN 1 tablet (10 mg total) daily with breakfast for 1 day. 03/03/20 03/10/20 Yes Dorie Rank, MD  amLODipine (NORVASC) 10 MG tablet Take 10 mg by mouth daily.    [provider]  ascorbic acid (VITAMIN C) 500 MG/5ML syrup Take by mouth daily.    [provider]  atorvastatin (LIPITOR) 10 MG tablet Take 10 mg by mouth daily.    [provider]  Chlorpheniramine-Acetaminophen (CORICIDIN HBP COLD/FLU PO) Take by mouth  as needed.     [provider]  Cholecalciferol 25 MCG (1000 UT) tablet Take 2,000 Units by mouth.     [provider]  DULoxetine (CYMBALTA) 30 MG capsule 1 tablet daily for 1 week, then take 1 twice daily 02/15/20   Kathrynn Ducking, MD  Fexofenadine HCl [facility] ALLERGY PO) Take by mouth as needed.     [provider]  hydrochlorothiazide (HYDRODIURIL) 25 MG tablet Take 25 mg by mouth daily.    [provider]  Magnesium Hydroxide (MILK OF MAGNESIA PO) Take by mouth daily as needed.    [provider]  Multiple Vitamin (MULTIVITAMIN ADULT PO) Take by mouth.    [provider]  mupirocin cream (BACTROBAN) 2 % Apply 1 application topically 2 (two) times  daily. 03/02/20   Charlann Lange, PA-C  polyethylene glycol (MIRALAX / GLYCOLAX) 17 g packet Take 17 g by mouth in the morning, at noon, and at bedtime.    [provider]  senna (SENOKOT) 8.6 MG tablet Take 2 tablets once daily as needed for constipation 01/05/20   [provider]  sildenafil (VIAGRA) 50 MG tablet Take 1/2-1 tab prior to sexual activity 06/10/19   [provider]  sucralfate (CARAFATE) 1 GM/10ML suspension Take 10 mLs (1 g total) by mouth 4 (four) times daily -  with meals and at bedtime. 02/07/20   Sherwood Gambler, MD  sulfamethoxazole-trimethoprim (BACTRIM DS) 800-160 MG tablet Take 1 tablet by mouth 2 (two) times daily for 7 days. 03/02/20 03/09/20  Charlann Lange, PA-C  valsartan (DIOVAN) 160 MG tablet Take 1 tablet (160 mg total) by mouth 2 (two) times daily. 01/31/20   [name], MD  XALATAN 0.005 % ophthalmic solution Place 1 drop into both eyes at bedtime.  05/07/15   [provider]    Allergies    Clonidine derivatives, Lisinopril, Other, Oysters [shellfish allergy], and Spironolactone  Review of Systems   Review of Systems  All other systems reviewed and are negative.   Physical Exam Updated Vital Signs BP 140/82   Pulse 79   Temp 98.4 F (36.9 C) (Oral)   Resp 18   Ht 1.956 m (6\' 5" )   Wt 109.8 kg   SpO2 99%   BMI 28.70 kg/m   Physical Exam Vitals and nursing note reviewed.  Constitutional:      General: He is not in acute distress.    Appearance: He is well-developed.  HENT:     Head: Normocephalic and atraumatic.     Comments: Patient does have erythema and macular papular rash involving his bilateral scalp.  He does have some areas of yellow crusting drainage.  There is some periorbital edema but no evidence of rash around the eyes.  No rash noted elsewhere    Right Ear: External ear normal.     Left Ear: External ear normal.  Eyes:     General: No scleral icterus.       Right eye: No discharge.         Left eye: No discharge.     Conjunctiva/sclera: Conjunctivae normal.  Neck:     Trachea: No tracheal deviation.  Cardiovascular:     Rate and Rhythm: Normal rate.  Pulmonary:     Effort: Pulmonary effort is normal. No respiratory distress.     Breath sounds: No stridor.  Abdominal:     General: There is no distension.  Musculoskeletal:        General: No swelling or  deformity.     Cervical back: Neck supple.  Skin:    General: Skin is warm and dry.     Findings: No rash.  Neurological:     Mental Status: He is alert.     Cranial Nerves: Cranial nerve deficit: no gross deficits.     ED Results / Procedures / Treatments    Procedures Procedures (including critical care time)  Medications Ordered in ED Medications  predniSONE (DELTASONE) tablet 40 mg (has no administration in time range)    ED Course  I have reviewed the triage vital signs and the nursing notes.  Pertinent labs & imaging results that were available during my care of the patient were reviewed by me and considered in my medical decision making (see chart for details).    MDM Rules/Calculators/A&P                          Appearance does suggest either possible impetigo or some type of contact or irritant dermatitis.  Patient denies any creams or lotions.  He has noticed more swelling and irritation.  I do think it is reasonable to try a short course of steroids to see if that would help with the inflammation.  He is already on antibiotic topical and oral therapy.  I will have him continue the Bactrim for now.  I will give him a prescription for doxycycline but I do not want him to change to that right now as its only been a day of the Bactrim.  If he is not noticing improvement in a day or 2 within switch to that antibiotic.  I do think would be helpful for him to see a dermatologist.  He will provide the name of a dermatology group to call.  Plan was discussed with the patient as well as his wife and the  patient's daughter who is a Marine scientist.  We are in agreement with this plan. Final Clinical Impression(s) / ED Diagnoses Final diagnoses:  Dermatitis  Impetigo    Rx / DC Orders ED Discharge Orders         Ordered    doxycycline (VIBRA-TABS) 100 MG tablet  2 times daily        03/03/20 1751    predniSONE (DELTASONE) 10 MG tablet        03/03/20 1751           Dorie Rank, MD 03/03/20 1755

## 2020-03-03 NOTE — ED Notes (Signed)
An After Visit Summary was printed and given to the patient. Discharge instructions given and no further questions at this time.  Pt leaving with wife.  

## 2020-03-03 NOTE — ED Triage Notes (Signed)
Patient reports that he had impetigo on his head and has worsened. Patient reports that he was seen last night for the same. Patient states that his facial swelling and headache have worsened. Patient talking in complete sentences and Sats normal.

## 2020-03-03 NOTE — ED Notes (Signed)
Refused d/c vitals.

## 2020-03-04 ENCOUNTER — Ambulatory Visit: Payer: Medicare Other | Admitting: Physician Assistant

## 2020-03-17 ENCOUNTER — Ambulatory Visit: Payer: Medicare Other | Admitting: Family Medicine

## 2020-03-18 ENCOUNTER — Encounter: Payer: Self-pay | Admitting: Neurology

## 2020-03-18 ENCOUNTER — Ambulatory Visit (INDEPENDENT_AMBULATORY_CARE_PROVIDER_SITE_OTHER): Payer: Medicare Other | Admitting: Neurology

## 2020-03-18 DIAGNOSIS — G4489 Other headache syndrome: Secondary | ICD-10-CM

## 2020-03-18 HISTORY — DX: Other headache syndrome: G44.89

## 2020-03-18 MED ORDER — NORTRIPTYLINE HCL 10 MG PO CAPS: ORAL_CAPSULE | ORAL | 3 refills | Status: DC

## 2020-03-18 MED ORDER — VITAMIN D 50 MCG (2000 UT) PO TABS: 2000.0000 [IU] | ORAL_TABLET | Freq: Every day | ORAL | Status: AC

## 2020-03-18 NOTE — Progress Notes (Signed)
Reason for visit: Head pressure, leg discomfort  Douglas Proctor is an 80 y.o. male  History of present illness:  Douglas Proctor is a 80 year old right-handed black male with a history of a myriad of symptoms that have occurred over the last several months.  He has had problems with labile hypertension.  He began feeling poorly when he had some medication adjustments for his blood pressure.  He has had some sensation of pressure inside of his head with occasional sharp pains across the top of the head.  The patient feels much better when he is exercising.  He takes Benadryl at night for sleep.  The patient has not been able to tolerate several medications due to blood pressure elevations including hydroxyzine, gabapentin, and Cymbalta.  The patient feels at times that he has discomfort in the legs associated with leg swelling.  He uses pressure stockings to help this.  The patient does not have any weakness or any significant gait instability associated with the current issues.  He returns to the office today for further evaluation.  Past Medical History:  Diagnosis Date  . Constipation   . Erectile dysfunction   . Essential hypertension 04/27/2019  . Murmur 12/11/2019  . Pure hypercholesterolemia 04/27/2019  . Renal artery stenosis ([place]) 02/08/2020   Mild stenosis on the left 12/2019.    Past Surgical History:  Procedure Laterality Date  . FOOT SURGERY Left    broken toe, bunion removed  . SHOULDER SURGERY Right     Family History  Problem Relation Age of Onset  . Other Mother        hemorrhage - pt was 73 years old  . CAD Father     Social history:  reports that he has never smoked. He has never used smokeless tobacco. He reports current alcohol use. He reports that he does not use drugs.    Allergies  Allergen Reactions  . Clonidine Derivatives     FELT SICK AND STATED FELT LIKE SPIDER WEBS ON HIM  . Lisinopril Swelling    Lips and face swelling  . Other     Cats and dogs  dander   . Oysters [Shellfish Allergy] Other (See Comments)    Unknown exact reaction- was tested  . Spironolactone     Swelling in lips/face     Medications:  Prior to Admission medications   Medication Sig Start Date End Date Taking? Authorizing Provider  amLODipine (NORVASC) 10 MG tablet Take 10 mg by mouth daily.   Yes [provider]  ascorbic acid (VITAMIN C) 500 MG/5ML syrup Take by mouth daily.   Yes [provider]  atorvastatin (LIPITOR) 10 MG tablet Take 10 mg by mouth daily.   Yes [provider]  Chlorpheniramine-Acetaminophen (CORICIDIN HBP COLD/FLU PO) Take by mouth as needed.    Yes [provider]  hydrochlorothiazide (HYDRODIURIL) 25 MG tablet Take 25 mg by mouth daily.   Yes [provider]  Magnesium Hydroxide (MILK OF MAGNESIA PO) Take by mouth daily as needed.   Yes [provider]  Multiple Vitamin (MULTIVITAMIN ADULT PO) Take by mouth.   Yes [provider]  polyethylene glycol (MIRALAX / GLYCOLAX) 17 g packet Take 17 g by mouth in the morning, at noon, and at bedtime.   Yes [provider]  sildenafil (VIAGRA) 50 MG tablet Take 1/2-1 tab prior to sexual activity 06/10/19  Yes [provider]  valsartan (DIOVAN) 160 MG tablet Take 1 tablet (160  mg total) by mouth 2 (two) times daily. 01/31/20  Yes [name], MD  XALATAN 0.005 % ophthalmic solution Place 1 drop into both eyes at bedtime.  05/07/15  Yes [provider]  Cholecalciferol 25 MCG (1000 UT) tablet Take 2,000 Units by mouth.     [provider]  doxycycline (VIBRA-TABS) 100 MG tablet Take 1 tablet (100 mg total) by mouth 2 (two) times daily. 03/03/20   Dorie Rank, MD  DULoxetine (CYMBALTA) 30 MG capsule 1 tablet daily for 1 week, then take 1 twice daily 02/15/20   Kathrynn Ducking, MD  Fexofenadine HCl [facility] ALLERGY PO) Take by mouth as needed.     [provider]  mupirocin cream (BACTROBAN)  2 % Apply 1 application topically 2 (two) times daily. 03/02/20   Charlann Lange, PA-C  senna (SENOKOT) 8.6 MG tablet Take 2 tablets once daily as needed for constipation 01/05/20   [provider]  sucralfate (CARAFATE) 1 GM/10ML suspension Take 10 mLs (1 g total) by mouth 4 (four) times daily -  with meals and at bedtime. 02/07/20   Sherwood Gambler, MD    ROS:  Out of a complete 14 system review of symptoms, the patient complains only of the following symptoms, and all other reviewed systems are negative.  Head pressure, head pain   Height 6' 5"  (1.956 m), weight 249 lb (112.9 kg).  Physical Exam  General: The patient is alert and cooperative at the time of the examination.  Skin: No significant peripheral edema is noted.   Neurologic Exam  Mental status: The patient is alert and oriented x 3 at the time of the examination. The patient has apparent normal recent and remote memory, with an apparently normal attention span and concentration ability.   Cranial nerves: Facial symmetry is present. Speech is normal, no aphasia or dysarthria is noted. Extraocular movements are full. Visual fields are full.  Motor: The patient has good strength in all 4 extremities.  Sensory examination: Soft touch sensation is symmetric on the face, arms, and legs.  Coordination: The patient has good finger-nose-finger and heel-to-shin bilaterally.  Gait and station: The patient has a slightly wide-based gait.  Tandem gait is slightly unsteady. Romberg is negative. No drift is seen.  Reflexes: Deep tendon reflexes are symmetric.   Assessment/Plan:  1.  Head pressure, head pain  2.  Leg discomfort  The patient has a myriad of symptoms, his abdominal symptoms have improved since last seen.  The patient has not been able tolerate various medications including gabapentin and Cymbalta.  We will give him a trial on low-dose nortriptyline.  He will follow up for his next scheduled visit in  March.  He will contact me if he is not tolerating the nortriptyline.  Jill Alexanders MD 03/18/2020 11:38 AM  [facility] [address] [place], [place] [address]  Phone [phone] Fax [phone]

## 2020-03-18 NOTE — Patient Instructions (Signed)
We will start nortriptyline at night.  Pamelor (nortriptyline) is an antidepressant medication that has many uses that may include headache, whiplash injuries, or for peripheral neuropathy pain. Side effects may include drowsiness, dry mouth, blurred vision, or constipation. As with any antidepressant medication, worsening depression may occur. If you had any significant side effects, please call our office. The full effects of this medication may take 7-10 days after starting the drug, or going up on the dose.

## 2020-03-20 ENCOUNTER — Ambulatory Visit: Payer: Medicare Other | Admitting: General Practice

## 2020-03-25 ENCOUNTER — Encounter: Payer: Self-pay | Admitting: Physician Assistant

## 2020-03-25 ENCOUNTER — Other Ambulatory Visit: Payer: Self-pay

## 2020-03-25 ENCOUNTER — Ambulatory Visit (INDEPENDENT_AMBULATORY_CARE_PROVIDER_SITE_OTHER): Payer: Medicare Other | Admitting: Physician Assistant

## 2020-03-25 VITALS — BP 138/78 | HR 72 | Ht 77.0 in | Wt 252.0 lb

## 2020-03-25 DIAGNOSIS — I10 Essential (primary) hypertension: Secondary | ICD-10-CM

## 2020-03-25 DIAGNOSIS — E785 Hyperlipidemia, unspecified: Secondary | ICD-10-CM

## 2020-03-25 MED ORDER — VALSARTAN 160 MG PO TABS: 160.0000 mg | ORAL_TABLET | Freq: Two times a day (BID) | ORAL | 3 refills | Status: AC

## 2020-03-25 NOTE — Progress Notes (Signed)
Cardiology Office Note:    Date:  03/27/2020   ID:  Douglas Proctor, DOB 1940/02/21, MRN [id number]  PCP:  Leeroy Cha, MD  [facility] Cardiologist:  [name], MD  [place] Electrophysiologist:  None   Referring MD: Josefa Half*   Chief Complaint  Patient presents with   Follow-up    History of Present Illness:    Douglas Proctor is a 80 y.o. male with a hx of hypertension and hyperlipidemia.  Patient was first diagnosed with hypertension in the 1970s.  Since the onset of pandemic, he has not exercised as usual and his blood pressure has been going up.  Terazosin did not help with his blood pressure.  He was placed on spironolactone instead with addition of doxazosin and night.  Due to uncontrolled blood pressure, carvedilol was added, however was eventually taken off as it did not help with his blood pressure.  He was started on hydralazine, unfortunately he was not tolerating this medication either.  He was started on clonidine by Dr. Oval Linsey, then less than a week later he went to the ED after episode of right leg numbness. Clonidine was eventually discontinued as well as he had a spikes in his blood pressure.  Spironolactone was increased to 50 mg daily.  Secondary hypertension work-up revealed elevated serum norepinephrine and epinephrine level.  24-hour urine test was inconclusive.  A CT of abdomen showed no adrenal adenoma.  Renal artery Doppler obtained in November 2021 revealed mild blockages in the left renal artery, normal to left.  Unfortunately, he ended up in the ED on 01/09/2020 with what appears to be allergic reaction from spironolactone with swelling in the face and mouth.  Symptom improved after Benadryl.  Combination of olmesartan/amlodipine/hydrochlorothiazide combination were stopped.  He was resumed on severed amlodipine, valsartan and hydrochlorothiazide by themselves.  During the last hypertension clinic visit, losartan was divided  into 160 mg twice a day.  A 14-day Zio monitor was ordered.  Her monitor demonstrated underlying sinus rhythm with average heart rate of 77 bpm, no significant pauses longer than 3.5 seconds.  There were several atrial runs up to 9 bpm, frequent PACs and couplets.  Patient presents today for follow-up. His blood pressure seems to be very well controlled at home ranging in the 120s to 130s. He denies any recent chest pain or shortness of breath. He has been very active at home. Most recent lipid panel obtained at [facility] showed a borderline elevated LDL of 102, however well controlled total cholesterol, triglyceride and HDL. He has not had any further dizzy spell or palpitation. I recommended continue on the current therapy. Since he is doing so well, I will recommend push back to the next appointment until 3 months from now. He can contact us if he wrangle any new issues until then.  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  Past Surgical History:  Procedure Laterality Date   FOOT SURGERY Left    broken toe, bunion removed   SHOULDER SURGERY Right     Current Medications: Current Meds  Medication Sig   amLODipine (NORVASC) 10 MG tablet Take 10 mg by mouth daily.   ascorbic acid (VITAMIN C) 500 MG/5ML syrup  Take by mouth daily.   atorvastatin (LIPITOR) 10 MG tablet Take 10 mg by mouth daily.   Chlorpheniramine-Acetaminophen (CORICIDIN HBP COLD/FLU PO) Take by mouth as needed.    Cholecalciferol (VITAMIN D) 50 MCG (2000 UT) tablet Take 1 tablet (2,000 Units total) by mouth daily.   EPINEPHrine 0.3 mg/0.3 mL IJ SOAJ injection    fexofenadine (ALLEGRA ALLERGY) 180 MG tablet 1 tablet swallow whole with water; do not take with fruit juices.   hydrochlorothiazide (HYDRODIURIL) 25 MG tablet Take 25 mg by mouth daily.   mometasone (ELOCON) 0.1 % cream 1 application a thin film to affected area   Multiple Vitamin (MULTIVITAMIN ADULT PO) Take by mouth.   nortriptyline (PAMELOR) 10 MG capsule Take one capsule at night for one week, then take 2 capsules at night   polyethylene glycol (MIRALAX / GLYCOLAX) 17 g packet Take 17 g by mouth as needed for mild constipation.   sildenafil (VIAGRA) 50 MG tablet Take 1/2-1 tab prior to sexual activity   XALATAN 0.005 % ophthalmic solution Place 1 drop into both eyes at bedtime.    [DISCONTINUED] valsartan (DIOVAN) 160 MG tablet Take 1 tablet (160 mg total) by mouth 2 (two) times daily.     Allergies:   Clonidine derivatives, Lisinopril, Other, Oysters [shellfish allergy], and Spironolactone   Social History   Socioeconomic History   Marital status: Married    Spouse name: Not on file   Number of children: 5   Years of education: college   Highest education level: Master's degree (e.g., MA, MS, Kerney Hopfensperger, MEd, MSW, MBA)  Occupational History   Occupation: Retired  Tobacco Use   Smoking status: Never Smoker   Smokeless tobacco: Never Used  Scientific laboratory technician Use: Never used  Substance and Sexual Activity   Alcohol use: Yes    Comment: one glass of wine each night, occasional beer   Drug use: Never   Sexual activity: Not on file  Other Topics Concern   Not on file  Social History Narrative   Lives at home with his wife.    Right-handed.   One cup coffee per day.   Social Determinants of Health   Financial Resource Strain: Not on file  Food Insecurity: Not on file  Transportation Needs: Not on file  Physical Activity: Not on file  Stress: Not on file  Social Connections: Not on file     Family History: The patient's family history includes CAD in his father; Other in his mother.  ROS:   Please see the history of present illness.     All other systems reviewed and are negative.  EKGs/Labs/Other Studies Reviewed:    The following studies were reviewed today:  Carotid Doppler 01/23/2020 Summary:  Right Carotid: Velocities in the right ICA are consistent with a 1-39%  stenosis.   Left Carotid: Velocities in the left ICA are consistent with a 1-39%  stenosis.   Vertebrals: Bilateral vertebral arteries demonstrate antegrade flow.  Subclavians: Normal flow hemodynamics were seen in bilateral subclavian arteries.    EKG:  EKG is not ordered today.   Recent Labs: 01/31/2020: Magnesium 2.1; TSH 0.730 02/07/2020: ALT  33; BUN 10; Creatinine, Ser 1.05; Hemoglobin 14.4; Platelets 232; Potassium 3.5; Sodium 142  Recent Lipid Panel No results found for: CHOL, TRIG, HDL, CHOLHDL, VLDL, LDLCALC, LDLDIRECT   Risk Assessment/Calculations:       Physical Exam:    VS:  BP 138/78    Pulse 72    Ht 6\' 5"  (1.956 m)    Wt 252 lb (114.3 kg)    BMI 29.88 kg/m     Wt Readings from Last 3 Encounters:  03/25/20 252 lb (114.3 kg)  03/18/20 249 lb (112.9 kg)  03/03/20 242 lb (109.8 kg)     GEN:  Well nourished, well developed in no acute distress HEENT: Normal NECK: No JVD; No carotid bruits LYMPHATICS: No lymphadenopathy CARDIAC: RRR, no murmurs, rubs, gallops RESPIRATORY:  Clear to auscultation without rales, wheezing or rhonchi  ABDOMEN: Soft, non-tender, non-distended MUSCULOSKELETAL:  No edema; No deformity  SKIN: Warm and dry NEUROLOGIC:  Alert and oriented x 3 PSYCHIATRIC:  Normal affect    ASSESSMENT:    1. Essential hypertension   2. Hyperlipidemia LDL goal <100    PLAN:    In order of problems listed above:  1. Hypertension: See HPI for list of blood pressure medication that has been previously tried.  Patient has sensitivity toward multiple medications.  Fortunately, he has settled down on the current regimen and is tolerating it well.  Continue hydrochlorothiazide 25 mg daily, amlodipine 10 mg daily and valsartan 160 mg twice a day.  2. Hyperlipidemia: On Lipitor.  Recent lipid panel obtained at [facility] showed LDL of 102, well controlled total cholesterol, triglyceride and HDL.        Medication Adjustments/Labs and Tests Ordered: Current medicines are reviewed at length with the patient today.  Concerns regarding medicines are outlined above.  No orders of the defined types were placed in this encounter.  No orders of the defined types were placed in this encounter.   Patient Instructions  Medication Instructions:  Your physician recommends that you continue on your current medications as directed. Please refer to the Current Medication list given to you today.  *If you need a refill on your cardiac medications before your next appointment, please call your pharmacy*  Lab Work: NONE ordered at this time of appointment   If you have labs (blood work) drawn today and your tests are completely normal, you will receive your results only by:  [place] (if you have MyChart) OR  A paper copy in the mail If you have any lab test that is abnormal or we need to change your treatment, we will call you to review the results.  Testing/Procedures: NONE ordered at this time of appointment   Follow-Up: At [facility], you and your health needs are our priority.  As part of our continuing mission to provide you with exceptional heart care, we have created designated Provider Care Teams.  These Care Teams include your primary Cardiologist (physician)  and Advanced Practice Providers (APPs -  Physician Assistants and Nurse Practitioners) who all work together to provide you with the care you need, when you need it.  Your next appointment:   3 month(s)  The format for your next appointment:   In Person  Provider:   [name], MD  Other Instructions      Signed, Almyra Deforest, [place]  03/27/2020 11:41 PM    [place]

## 2020-03-25 NOTE — Patient Instructions (Signed)
Medication Instructions:  Your physician recommends that you continue on your current medications as directed. Please refer to the Current Medication list given to you today.  *If you need a refill on your cardiac medications before your next appointment, please call your pharmacy*  Lab Work: NONE ordered at this time of appointment   If you have labs (blood work) drawn today and your tests are completely normal, you will receive your results only by: . MyChart Message (if you have MyChart) OR . A paper copy in the mail If you have any lab test that is abnormal or we need to change your treatment, we will call you to review the results.  Testing/Procedures: NONE ordered at this time of appointment   Follow-Up: At [facility], you and your health needs are our priority.  As part of our continuing mission to provide you with exceptional heart care, we have created designated Provider Care Teams.  These Care Teams include your primary Cardiologist (physician) and Advanced Practice Providers (APPs -  Physician Assistants and Nurse Practitioners) who all work together to provide you with the care you need, when you need it.  Your next appointment:   3 month(s)  The format for your next appointment:   In Person  Provider:   Tiffany Nisswa, MD  Other Instructions   

## 2020-04-15 ENCOUNTER — Ambulatory Visit: Payer: Medicare Other | Admitting: Cardiovascular Disease

## 2020-04-30 ENCOUNTER — Ambulatory Visit (INDEPENDENT_AMBULATORY_CARE_PROVIDER_SITE_OTHER): Payer: Medicare Other | Admitting: Neurology

## 2020-04-30 ENCOUNTER — Ambulatory Visit: Payer: Medicare Other | Admitting: Neurology

## 2020-04-30 ENCOUNTER — Encounter: Payer: Self-pay | Admitting: Neurology

## 2020-04-30 VITALS — BP 170/97 | HR 87 | Ht 77.0 in | Wt 254.8 lb

## 2020-04-30 DIAGNOSIS — R202 Paresthesia of skin: Secondary | ICD-10-CM | POA: Diagnosis not present

## 2020-04-30 DIAGNOSIS — G4489 Other headache syndrome: Secondary | ICD-10-CM

## 2020-04-30 NOTE — Progress Notes (Signed)
Reason for visit: Headache syndrome, numbness in the hands  Douglas Proctor is an 80 y.o. male  History of present illness:  Douglas Proctor is a 80 year old right-handed black male with a history of a headache syndrome, he has been placed on nortriptyline and he takes 20 mg at night and feels much better with this.  He occasionally will have some pains across the top of his head but this has become less frequent.  He does have occasional neck pain.  He reports some tingling in the fingertips of the hands, if the rolls on the left side at night it will increase his arm numbness.  He denies any numbness in the feet.  He feels much better with his stomach, he has been able to gain weight after he went on probiotics.  Overall, he feels much better.  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  Past Medical History:  Diagnosis Date  . Constipation   . Erectile dysfunction   . Essential hypertension 04/27/2019  . 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 Murmur 12/11/2019  . Pure hypercholesterolemia 04/27/2019  . 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    Past Surgical History:  Procedure Laterality Date  . FOOT SURGERY Left    broken toe, bunion removed  . SHOULDER SURGERY Right     Family History  Problem Relation Age of Onset  . Other Mother        hemorrhage - pt was 77 years old  . CAD Father     Social history:  reports that he has never smoked. He has never used smokeless tobacco. He reports current alcohol use. He reports that he does not use drugs.    Allergies  Allergen Reactions  . Clonidine Derivatives     FELT SICK AND STATED FELT LIKE SPIDER WEBS ON HIM  . Lisinopril Swelling    Lips and face swelling  . Other     Cats and dogs dander   . Oysters [Shellfish Allergy] Other (See Comments)    Unknown exact reaction- was tested  . Spironolactone     Swelling in lips/face     Medications:  Prior to Admission  medications   Medication Sig Start Date End Date Taking? Authorizing Provider  amLODipine (NORVASC) 10 MG tablet Take 10 mg by mouth daily.   Yes [provider]  ascorbic acid (VITAMIN C) 500 MG/5ML syrup Take by mouth daily.   Yes [provider]  atorvastatin (LIPITOR) 10 MG tablet Take 10 mg by mouth daily.   Yes [provider]  azelastine (OPTIVAR) 0.05 % ophthalmic solution  03/24/20  Yes [provider]  Chlorpheniramine-Acetaminophen (CORICIDIN HBP COLD/FLU PO) Take by mouth as needed.    Yes [provider]  Cholecalciferol (VITAMIN D) 50 MCG (2000 UT) tablet Take 1 tablet (2,000 Units total) by mouth daily. 03/18/20  Yes Kathrynn Ducking, MD  EPINEPHrine 0.3 mg/0.3 mL IJ SOAJ injection  03/06/20  Yes [provider]  fexofenadine (ALLEGRA) 180 MG tablet 1 tablet swallow whole with water; do not take with fruit juices. 03/24/20  Yes [provider]  fluticasone (FLONASE) 50 MCG/ACT nasal spray Place 1 spray into both nostrils 2 (two) times daily. 03/24/20  Yes [provider]  hydrochlorothiazide (HYDRODIURIL) 25 MG tablet Take 25 mg by mouth daily.   Yes [provider]  mometasone (ELOCON) 0.1 % cream 1 application a thin  film to affected area   Yes [provider]  Multiple Vitamin (MULTIVITAMIN ADULT PO) Take by mouth.   Yes [provider]  nortriptyline (PAMELOR) 10 MG capsule Take one capsule at night for one week, then take 2 capsules at night 03/18/20  Yes Kathrynn Ducking, MD  polyethylene glycol (MIRALAX / GLYCOLAX) 17 g packet Take 17 g by mouth as needed for mild constipation.   Yes [provider]  sildenafil (VIAGRA) 50 MG tablet Take 1/2-1 tab prior to sexual activity 06/10/19  Yes [provider]  valsartan (DIOVAN) 160 MG tablet Take 1 tablet (160 mg total) by mouth 2 (two) times daily. 03/25/20 03/20/21 Yes [name], MD  XALATAN 0.005 % ophthalmic solution  Place 1 drop into both eyes at bedtime.  05/07/15  Yes [provider]    ROS:  Out of a complete 14 system review of symptoms, the patient complains only of the following symptoms, and all other reviewed systems are negative.  Arthritis pain Numbness in the hands Headache  Blood pressure (!) 170/97, pulse 87, height 6\' 5"  (1.956 m), weight 254 lb 12.8 oz (115.6 kg).  Physical Exam  General: The patient is alert and cooperative at the time of the examination.  Skin: 1-2+ edema below the knees is seen bilaterally.   Neurologic Exam  Mental status: The patient is alert and oriented x 3 at the time of the examination. The patient has apparent normal recent and remote memory, with an apparently normal attention span and concentration ability.   Cranial nerves: Facial symmetry is present. Speech is normal, no aphasia or dysarthria is noted. Extraocular movements are full. Visual fields are full.  Motor: The patient has good strength in all 4 extremities.  Sensory examination: Soft touch sensation is symmetric on the face, arms, and legs.  Coordination: The patient has good finger-nose-finger bilaterally.  The patient has difficulty performing heel-to-shin bilaterally.  Tinel's sign at the wrists are negative bilaterally  Gait and station: The patient has a slightly wide-based gait.  Tandem gait was not tested.  Patient somewhat stooped with walking.  Romberg is negative.  Reflexes: Deep tendon reflexes are symmetric.   Assessment/Plan:  1.  Headache syndrome  2.  Hand paresthesias  The patient will be set up for nerve conduction studies on both arms and EMG on the left arm.  He will continue the nortriptyline for the headache.  He will follow up here in 4 months.   Jill Alexanders MD 04/30/2020 2:40 PM  [facility] [address] [place], [place] [address]  Phone [phone] Fax [phone]

## 2020-05-13 ENCOUNTER — Ambulatory Visit (INDEPENDENT_AMBULATORY_CARE_PROVIDER_SITE_OTHER): Payer: Medicare Other | Admitting: Neurology

## 2020-05-13 ENCOUNTER — Encounter: Payer: Self-pay | Admitting: Neurology

## 2020-05-13 DIAGNOSIS — R202 Paresthesia of skin: Secondary | ICD-10-CM | POA: Diagnosis not present

## 2020-05-13 DIAGNOSIS — G5603 Carpal tunnel syndrome, bilateral upper limbs: Secondary | ICD-10-CM | POA: Insufficient documentation

## 2020-05-13 HISTORY — DX: Carpal tunnel syndrome, bilateral upper limbs: G56.03

## 2020-05-13 NOTE — Procedures (Signed)
     HISTORY:  Douglas Proctor is a 80 year old gentleman with a history of numbness on both hands, left greater than right.  He notes numbness of the left arm that comes on when he rolls over on his left side at nighttime.  He is being evaluated for a possible neuropathy or a cervical radiculopathy.  NERVE CONDUCTION STUDIES:  Nerve conduction studies were performed on both upper extremities.  The distal motor latencies for the median nerves were prolonged bilaterally with normal motor amplitudes for these nerves bilaterally.  The distal motor latencies and motor amplitudes for the ulnar nerves were normal bilaterally.  The nerve conduction velocities for the median and ulnar nerves were normal bilaterally.  The sensory latencies for the median nerves were prolonged bilaterally, and the ulnar sensory latencies were prolonged bilaterally, but due to body habitus, the distance from stimulation to pick up was longer than usual, 12 cm versus 11 cm.  The ulnar F-wave latencies were within normal limits bilaterally.  EMG STUDIES:  EMG study was performed on the left upper extremity:  The first dorsal interosseous muscle reveals 2 to 4 K units with full recruitment. No fibrillations or positive waves were noted. The abductor pollicis brevis muscle reveals 2 to 4 K units with full recruitment, fast firing units were noted. No fibrillations or positive waves were noted. The extensor indicis proprius muscle reveals 1 to 3 K units with full recruitment. No fibrillations or positive waves were noted. The pronator teres muscle reveals 2 to 3 K units with full recruitment. No fibrillations or positive waves were noted. The biceps muscle reveals 1 to 2 K units with full recruitment. No fibrillations or positive waves were noted. The triceps muscle reveals 2 to 4 K units with full recruitment. No fibrillations or positive waves were noted. The anterior deltoid muscle reveals 2 to 3 K units with full recruitment.  No fibrillations or positive waves were noted. The cervical paraspinal muscles were tested at 2 levels. No abnormalities of insertional activity were seen at either level tested. There was good relaxation.   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  EMG evaluation of the left upper extremity is relatively unremarkable, no evidence of an overlying cervical radiculopathy was noted.  Jill Alexanders MD 05/13/2020 3:17 PM  [facility] [address] [place], [place] [address]  Phone [phone] Fax [phone]

## 2020-05-13 NOTE — Progress Notes (Addendum)
The patient comes in for EMG and nerve conduction study evaluation today.  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  EMG of the left arm does not show evidence of an overlying cervical radiculopathy.  The patient will wear wrist splints on the hands bilaterally over the next 2 months, if he is not getting better he is to contact our office and we will make a referral to a hand surgeon.    [place]    Nerve / Sites Muscle Latency Ref. Amplitude Ref. Rel Amp Segments Distance Velocity Ref. Area    ms ms mV mV %  cm m/s m/s mVms  R Median - APB     Wrist APB 5.0 ?4.4 5.2 ?4.0 100 Wrist - APB 7   23.7     Upper arm APB 10.4  4.3  84.3 Upper arm - Wrist 27 50 ?49 21.1  L Median - APB     Wrist APB 4.8 ?4.4 6.1 ?4.0 100 Wrist - APB 7   25.9     Upper arm APB 10.3  5.8  96.4 Upper arm - Wrist 27 49 ?49 26.2  R Ulnar - ADM     Wrist ADM 3.3 ?3.3 8.4 ?6.0 100 Wrist - ADM 7   31.4     B.Elbow ADM 8.2  7.2  86.3 B.Elbow - Wrist 25 51 ?49 29.6     A.Elbow ADM 10.2  7.6  105 A.Elbow - B.Elbow 10 50 ?49 31.2  L Ulnar - ADM     Wrist ADM 3.3 ?3.3 8.7 ?6.0 100 Wrist - ADM 7   24.4     B.Elbow ADM 8.3  7.2  82.3 B.Elbow - Wrist 27 54 ?49 23.1     A.Elbow ADM 10.2  7.3  101 A.Elbow - B.Elbow 10 51 ?49 22.9             SNC    Nerve / Sites Rec. Site Peak Lat Ref.  Amp Ref. Segments Distance    ms ms V V  cm  R Median - Orthodromic (Dig II, Mid palm)     Dig II Wrist 4.5 ?3.4 6 ?10 Dig II - Wrist 13  L Median - Orthodromic (Dig II, Mid palm)     Dig II Wrist 3.9 ?3.4 8 ?10 Dig II - Wrist 13  R Ulnar - Orthodromic, (Dig V, Mid palm)     Dig V Wrist 3.4 ?3.1 6 ?5 Dig V - Wrist 12  L Ulnar - Orthodromic, (Dig V, Mid palm)     Dig V Wrist 3.7 ?3.1 5 ?5 Dig V - Wrist 70             F  Wave    Nerve F Lat Ref.   ms ms  R Ulnar - ADM 31.9 ?32.0  L Ulnar - ADM 31.6 ?32.0

## 2020-05-13 NOTE — Progress Notes (Signed)
Please refer to EMG and nerve conduction procedure note.  

## 2020-06-25 NOTE — Progress Notes (Signed)
Cardiology Office Note   Date:  06/26/2020   ID:  Douglas Proctor, DOB 02-22-1940, MRN [id number]  PCP:  Leeroy Cha, MD  Cardiologist:   Douglas Latch, MD   Chief Complaint  Patient presents with  . Follow-up    3 months.     History of Present Illness: Douglas Proctor is a 80 y.o. male with hypertension and hyperlipidemia here for follow-up.  He was initially seen 03/2019 for management of hypertension. He was first diagnosed with hypertension in the 1970s.  It was previously well-controlled prior to the pandemic.  However lately he has not been able to go to the gym.  Prior to that he was started on Terazosin after his blood pressure was in the 150s over 90s at [facility].  Terazosin was discontinued as he had not seen any improvement in his blood pressure.  He was started on spironolactone instead.  He has tolerated this well.  His blood pressures have been mostly in the 130s to 140s over 80s.  He has started back exercising.  He uses the recumbent bike at least 3 to 4 days/week.  He rides it for 30 minutes and has no exertional chest pain or shortness of breath.  He has some mild lower extremity edema by the end of the day that improves with elevation and compression stockings.  He has no orthopnea or PND.  His wife does most of the cooking and she limits their salt intake.  He continues to help take care of his grandchildren and enjoys doing this.  [name] blood pressure was slightly above goal.  He wanted to keep working on diet and exercise before making any changes.   He followed up with Douglas Proctor on 06/2019 and his blood pressure is nearly at goal.  He was started on doxazosin 1 mg nightly and subsequently increased to 2 mg.  His blood pressure was well-controlled but he developed urinary symptoms.  BP was not controlled and didn't change with the addition of carvedilol.  Carvedilol was stopped and he started hydralazine. Mr. Brys was not tolerating hydralazine.   This was discontinued and he was started on clonidine.  Less than a week later he was in the ED after an episode of right leg numbness.  At home his blood pressure was also elevated.  By the time he got to the ED he was neurologically intact with no evidence of stroke.  His blood pressure had improved to 137/82.  He followed up with our pharmacist 11/16 and his blood pressure was 130/82.  It was noted that he had several numbers that were spiking and clonidine was discontinued.  Spironolactone was increased to 50 mg.  Secondary hypertension work-up revealed elevated serum norepinephrine and epinephrine levels. He had a CT of the abdomen 01/2020 and there were no adrenal adenomas. He followed up with Douglas Proctor on 03/2020 and his blood pressure was better controlled.    Today, he is feeling pretty good overall. He presents a blood pressure and heart rate log today. He notes his blood pressure is well controlled but his heart rate is higher on average.  His BP has been 110-137/70-80s.  His heart rate is in the 70s to 90s and he wonders if this IS okay.  He also brings tracings from his cardia mobile device that have revealed sinus rhythm.  He continues to exercise with his bike and weights, and he feels great with exercising. No exertional symptoms to note and he  continues to increase resistances. He has occasional right LE pain, but denies any LE edema. He denies any chest pain, shortness of breath, or palpitations. No pre-syncope, syncope, or lightheadedness to note. Also has no orthopnea or PND. He continues to tolerate his medications and he knows they are working.  Previous mediations: Carvedilol- ineffective doxazisin-urinary symptoms Hydralazine Clonidine- BP spiked spironolactone  Past Medical History:  Diagnosis Date  . 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 Constipation   . Erectile dysfunction   . Essential hypertension 04/27/2019  . 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 Murmur 12/11/2019  .  Pure hypercholesterolemia 04/27/2019  . Renal artery stenosis (Douglas Proctor) 02/08/2020   Mild stenosis on the left 12/2019.    Past Surgical History:  Procedure Laterality Date  . FOOT SURGERY Left    broken toe, bunion removed  . SHOULDER SURGERY Right      Current Outpatient Medications  Medication Sig Dispense Refill  . amLODipine (NORVASC) 10 MG tablet Take 10 mg by mouth daily.    Marland Kitchen ascorbic acid (VITAMIN C) 500 MG/5ML syrup Take by mouth daily.    Marland Kitchen atorvastatin (LIPITOR) 10 MG tablet Take 10 mg by mouth daily.    Marland Kitchen azelastine (OPTIVAR) 0.05 % ophthalmic solution     . Chlorpheniramine-Acetaminophen (CORICIDIN HBP COLD/FLU PO) Take by mouth as needed.     . Cholecalciferol (VITAMIN D) 50 MCG (2000 UT) tablet Take 1 tablet (2,000 Units total) by mouth daily.    Marland Kitchen EPINEPHrine 0.3 mg/0.3 mL IJ SOAJ injection     . fexofenadine (ALLEGRA) 180 MG tablet 1 tablet swallow whole with water; do not take with fruit juices.    . fluticasone (FLONASE) 50 MCG/ACT nasal spray Place 1 spray into both nostrils 2 (two) times daily.    . hydrochlorothiazide (HYDRODIURIL) 25 MG tablet Take 25 mg by mouth daily.    . mometasone (ELOCON) 0.1 % cream 1 application a thin film to affected area    . Multiple Vitamin (MULTIVITAMIN ADULT PO) Take by mouth.    . nortriptyline (PAMELOR) 10 MG capsule Take one capsule at night for one week, then take 2 capsules at night 60 capsule 3  . polyethylene glycol (MIRALAX / GLYCOLAX) 17 g packet Take 17 g by mouth as needed for mild constipation.    . sildenafil (VIAGRA) 50 MG tablet Take 1/2-1 tab prior to sexual activity    . valsartan (DIOVAN) 160 MG tablet Take 1 tablet (160 mg total) by mouth 2 (two) times daily. 180 tablet 3  . XALATAN 0.005 % ophthalmic solution Place 1 drop into both eyes at bedtime.      No current facility-administered medications for this visit.    Allergies:   Clonidine derivatives, Lisinopril, Other, Oysters [shellfish allergy], and  Spironolactone    Social History:  The patient  reports that he has never smoked. He has never used smokeless tobacco. He reports current alcohol use. He reports that he does not use drugs.   Family History:  The patient's family history includes CAD in his father; Other in his mother.    ROS:   Please see the history of present illness. (+) Right LE pain All other systems are reviewed and negative.    PHYSICAL EXAM: VS:  BP (!) 152/82   Pulse 90   Ht 6\' 5"  (1.956 m)   Wt 254 lb (115.2 kg)   BMI 30.12 kg/m  , BMI Body mass index is 30.12 kg/m. GENERAL:  Well appearing HEENT:  Pupils equal round and reactive, fundi not visualized, oral mucosa unremarkable NECK:  No jugular venous distention, waveform within normal limits, carotid upstroke brisk and symmetric, no bruits LUNGS:  Clear to auscultation bilaterally HEART:  RRR.  PMI not displaced or sustained,S1 and S2 within normal limits, no S3, no S4, no clicks, no rubs, 2/6 systolic murmur ABD:  Flat, positive bowel sounds normal in frequency in pitch, no bruits, no rebound, no guarding, no midline pulsatile mass, no hepatomegaly, no splenomegaly EXT:  2 plus pulses throughout, no edema, no cyanosis no clubbing SKIN:  No rashes no nodules NEURO:  Cranial nerves II through XII grossly intact, motor grossly intact throughout PSYCH:  Cognitively intact, oriented to person place and time   EKG:   06/26/2020: EKG is not ordered today. 01/08/2020: EKG was not ordered. 12/25/2019: Sinus rhythm.  Rate 92 bpm. 04/27/2019: Sinus rhythm.  Rate 74 bpm.  First-degree AV block.  PVCs.  Recent Labs: 01/31/2020: Magnesium 2.1; TSH 0.730 02/07/2020: ALT 33; BUN 10; Creatinine, Ser 1.05; Hemoglobin 14.4; Platelets 232; Potassium 3.5; Sodium 142    Lipid Panel No results found for: CHOL, TRIG, HDL, CHOLHDL, VLDL, LDLCALC, LDLDIRECT    Wt Readings from Last 3 Encounters:  06/26/20 254 lb (115.2 kg)  04/30/20 254 lb 12.8 oz (115.6 kg)   03/25/20 252 lb (114.3 kg)     12 Day Zio Monitor 03/04/2020: Quality: Fair.  Baseline artifact. Predominant rhythm: sinus rhythm Average heart rate: 77 bpm Max heart rate: 158 bpm Min heart rate: 48 bpm Pauses >2.5 seconds: none  There were several atrial runs up to 9 beats at a time Frequent PACs (2.1%) and couplets (<1%) Rare PVCs (<1%) and couplets  Up to 4 beats NSVT  Carotid Arterial Duplex 01/23/2020: Summary:  Right Carotid: Velocities in the right ICA are consistent with a 1-39%  stenosis.   Left Carotid: Velocities in the left ICA are consistent with a 1-39%  stenosis.   Vertebrals: Bilateral vertebral arteries demonstrate antegrade flow.  Subclavians: Normal flow hemodynamics were seen in bilateral subclavian        arteries.   Vas US Renal Artery 01/01/2020: Summary:  Largest Aortic Diameter: 2.4 cm    Renal:    Right: Normal size right kidney. Normal right Resistive Index.     Normal cortical thickness of right kidney. No evidence of     right renal artery stenosis. RRV flow present. Cyst(s) noted.     See technologist notes.  Left: Normal size of left kidney. Normal left Resistive Index.     Normal cortical thickness of the left kidney. 1-59% stenosis     of the left renal artery. Elevated velocities noted at the     origin likely due to vessel tortuosity. LRV flow present.     Cyst(s) noted. See technologist notes.  Mesenteric:  Normal Celiac artery and Superior Mesenteric artery findings.    Patent IVC.     ASSESSMENT AND PLAN: Essential hypertension Blood pressure has been well-controlled at home.  They have been ranging from the 110s to the 120s for the most part with very rare 140s.  Continue current regimen of amlodipine, hydrochlorothiazide, and valsartan.  Renal artery stenosis (HCC) Mild left renal artery stenosis 12/2020.  We will repeat at follow-up.  Pure hypercholesterolemia Continue  atorvastatin.  Murmur This is chronic.  He has no heart failure symptoms and is euvolemic on exam.    Current medicines are reviewed at length with the patient today.  The  patient does not have concerns regarding medicines.  The following changes have been made:  no change  Labs/ tests ordered today include:   No orders of the defined types were placed in this encounter.    Disposition:   FU with Apollonia Amini C. Oval Linsey, MD, [facility] in 6 months.   I,Mathew Stumpf,acting as a Education administrator for Douglas Latch, MD.,have documented all relevant documentation on the behalf of Douglas Latch, MD,as directed by  Douglas Latch, MD while in the presence of Douglas Latch, MD.  I, Sedan Oval Linsey, MD have reviewed all documentation for this visit.  The documentation of the exam, diagnosis, procedures, and orders on 06/26/2020 are all accurate and complete.   Signed, Linley Moskal C. Oval Linsey, MD, [facility]  06/26/2020 11:27 AM    [place]

## 2020-06-26 ENCOUNTER — Other Ambulatory Visit: Payer: Self-pay

## 2020-06-26 ENCOUNTER — Encounter: Payer: Self-pay | Admitting: Cardiovascular Disease

## 2020-06-26 ENCOUNTER — Ambulatory Visit (INDEPENDENT_AMBULATORY_CARE_PROVIDER_SITE_OTHER): Payer: Medicare Other | Admitting: Cardiovascular Disease

## 2020-06-26 VITALS — BP 152/82 | HR 90 | Ht 77.0 in | Wt 254.0 lb

## 2020-06-26 DIAGNOSIS — I10 Essential (primary) hypertension: Secondary | ICD-10-CM | POA: Diagnosis not present

## 2020-06-26 DIAGNOSIS — R011 Cardiac murmur, unspecified: Secondary | ICD-10-CM

## 2020-06-26 DIAGNOSIS — I701 Atherosclerosis of renal artery: Secondary | ICD-10-CM | POA: Diagnosis not present

## 2020-06-26 DIAGNOSIS — E7800 Pure hypercholesterolemia, unspecified: Secondary | ICD-10-CM

## 2020-06-26 NOTE — Assessment & Plan Note (Signed)
This is chronic.  He has no heart failure symptoms and is euvolemic on exam.

## 2020-06-26 NOTE — Assessment & Plan Note (Signed)
Mild left renal artery stenosis 12/2020.  We will repeat at follow-up.

## 2020-06-26 NOTE — Patient Instructions (Addendum)
Medication Instructions:  Your physician recommends that you continue on your current medications as directed. Please refer to the Current Medication list given to you today.   *If you need a refill on your cardiac medications before your next appointment, please call your pharmacy*  Lab Work: NONE   Testing/Procedures: NONE  Follow-Up: At [facility], you and your health needs are our priority.  As part of our continuing mission to provide you with exceptional heart care, we have created designated Provider Care Teams.  These Care Teams include your primary Cardiologist (physician) and Advanced Practice Providers (APPs -  Physician Assistants and Nurse Practitioners) who all work together to provide you with the care you need, when you need it.  We recommend signing up for the patient portal called "MyChart".  Sign up information is provided on this After Visit Summary.  MyChart is used to connect with patients for Virtual Visits (Telemedicine).  Patients are able to view lab/test results, encounter notes, upcoming appointments, etc.  Non-urgent messages can be sent to your provider as well.   To learn more about what you can do with MyChart, go to [email or web address].    Your next appointment:   6 month(s)  The format for your next appointment:   In Person  Provider:   DR [place]ula AT [facility]    

## 2020-06-26 NOTE — Assessment & Plan Note (Signed)
Blood pressure has been well-controlled at home.  They have been ranging from the 110s to the 120s for the most part with very rare 140s.  Continue current regimen of amlodipine, hydrochlorothiazide, and valsartan.

## 2020-06-26 NOTE — Assessment & Plan Note (Signed)
Continue atorvastatin

## 2020-09-01 ENCOUNTER — Ambulatory Visit: Payer: Medicare Other | Admitting: Neurology

## 2020-09-03 ENCOUNTER — Ambulatory Visit: Payer: Medicare Other | Admitting: Neurology

## 2021-01-01 ENCOUNTER — Ambulatory Visit (INDEPENDENT_AMBULATORY_CARE_PROVIDER_SITE_OTHER): Payer: Medicare Other | Admitting: Physician Assistant

## 2021-01-01 ENCOUNTER — Encounter (HOSPITAL_BASED_OUTPATIENT_CLINIC_OR_DEPARTMENT_OTHER): Payer: Self-pay | Admitting: Physician Assistant

## 2021-01-01 ENCOUNTER — Other Ambulatory Visit: Payer: Self-pay

## 2021-01-01 VITALS — BP 134/82 | HR 87 | Ht 77.0 in | Wt 257.8 lb

## 2021-01-01 DIAGNOSIS — R011 Cardiac murmur, unspecified: Secondary | ICD-10-CM

## 2021-01-01 DIAGNOSIS — I701 Atherosclerosis of renal artery: Secondary | ICD-10-CM | POA: Diagnosis not present

## 2021-01-01 DIAGNOSIS — I10 Essential (primary) hypertension: Secondary | ICD-10-CM

## 2021-01-01 DIAGNOSIS — E785 Hyperlipidemia, unspecified: Secondary | ICD-10-CM | POA: Diagnosis not present

## 2021-01-01 NOTE — Progress Notes (Signed)
Office Visit    Patient Name: Douglas Proctor Date of Encounter: 01/01/2021  PCP:  Leeroy Cha, [place]  Cardiologist:  [name], MD  Advanced Practice Provider:  No care team member to display Electrophysiologist:  None   Chief Complaint    Douglas Proctor is a 80 y.o. male with a hx of hypertension, hyperlipidemia, renal artery stenosis presents today for follow up of hypertension.   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   Past Surgical History:  Procedure Laterality Date   FOOT SURGERY Left    broken toe, bunion removed   SHOULDER SURGERY Right     Allergies  Allergies  Allergen Reactions   Clonidine Derivatives     FELT SICK AND STATED FELT LIKE SPIDER WEBS ON HIM   Lisinopril Swelling    Lips and face swelling   Other     Cats and dogs dander    Oysters [Shellfish Allergy] Other (See Comments)    Unknown exact reaction- was tested   Spironolactone     Swelling in lips/face     History of Present Illness    Douglas Proctor is a 80 y.o. male with a hx of hypertension, hyperlipidemia, renal artery stenosis last seen 06/26/20.  First diagnosed with hypertension in the 1970s. Was well controlled prior to pandemic but lack of exercise during pandemic led to elevated blood pressure. Terazosin was discontinued and Spironolactone initiated. However, he did not tolerate due to swelling in his lips and face. He was then started on Doxazosin but had urinary symptoms. He was then started on Coreg with no improvement. Hydralazine initiated which he did not tolerate and was transitioned to Clonidine. Secondary  hypertension workup with elevated serum norephinephrine and epinephrine levels. He had CT of abdomen 01/2020 with no adrenal adenomas. Renal duplex 12/2019 with left renal artery 1-59% stenosis.  02/2020 ZIO with predominantly NSR 77 bpm with several atrial runs up to 9 beats and frequent PAC 2.1% burden. He had up to 4 beats of NSVT. He was overall asymptomatic and politely declined addition of Diltiazem 30mg  BID.   He was last seen 06/26/20 by Dr. Oval Linsey. BP was well controlled at home and heart rate 70s-90s. He was exercising regularly. His current medications of Amlodipine, HCTZ, and Valsartan were continued.   He presents today for follow up. Today, he is doing well. 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 He does have some swelling in his left leg today which is likely due to his recent knee replacement. He has not been elevating it which he now plans to do. We discussed his last LDL which was done back in June and was 90. His goal would be < 70. He sees his PCP next month and would like to do the fasting Lipid at that time just in case his PCP wants to draw more labs. His BP has been well controlled at home. We will need to repeat a renal US in the future but patient politely declined at this time. He does not endorse any  palpitations but just states his neck pulse becomes more prominent when he lays down at night. He has not had any fluttering in his chest.   Reports no shortness of breath nor dyspnea on exertion. Reports no chest pain, pressure, or tightness. No edema, orthopnea, PND. Reports no palpitations.    EKGs/Labs/Other Studies Reviewed:   The following studies were reviewed today:  12 Day Zio Monitor 03/04/2020: Quality: Fair.  Baseline artifact. Predominant rhythm: sinus rhythm Average heart rate: 77 bpm Max heart rate: 158 bpm Min heart rate: 48 bpm Pauses >2.5 seconds: none   There were several atrial  runs up to 9 beats at a time Frequent PACs (2.1%) and couplets (<1%) Rare PVCs (<1%) and couplets  Up to 4 beats NSVT   Carotid Arterial Duplex 01/23/2020: Summary:  Right Carotid: Velocities in the right ICA are consistent with a 1-39%  stenosis.   Left Carotid: Velocities in the left ICA are consistent with a 1-39%  stenosis.   Vertebrals:  Bilateral vertebral arteries demonstrate antegrade flow.  Subclavians: Normal flow hemodynamics were seen in bilateral subclavian               arteries.    Vas US Renal Artery 01/01/2020: Summary:  Largest Aortic Diameter: 2.4 cm     Renal:     Right: Normal size right kidney. Normal right Resistive Index.         Normal cortical thickness of right kidney. No evidence of         right renal artery stenosis. RRV flow present. Cyst(s) noted.         See technologist notes.  Left:  Normal size of left kidney. Normal left Resistive Index.         Normal cortical thickness of the left kidney. 1-59% stenosis         of the left renal artery. Elevated velocities noted at the         origin likely due to vessel tortuosity. LRV flow present.         Cyst(s) noted. See technologist notes.  Mesenteric:  Normal Celiac artery and Superior Mesenteric artery findings.     Patent IVC.   EKG:  EKG is ordered today.  The ekg ordered today demonstrates NSR, rate 87.   Recent Labs: 01/31/2020: Magnesium 2.1; TSH 0.730 02/07/2020: ALT 33; BUN 10; Creatinine, Ser 1.05; Hemoglobin 14.4; Platelets 232; Potassium 3.5; Sodium 142  Recent Lipid Panel No results found for: CHOL, TRIG, HDL, CHOLHDL, VLDL, LDLCALC, LDLDIRECT  Home Medications   Current Meds  Medication Sig   fluticasone (FLONASE) 50 MCG/ACT nasal spray Place 1 spray into both nostrils 2 (two) times daily.     Review of Systems      All other systems reviewed and are otherwise negative except as noted above.  Physical Exam    VS:  Ht 6\' 5"  (1.956 m)   Wt 257 lb 12.8 oz (116.9 kg)    BMI 30.57 kg/m  , BMI Body mass index is 30.57 kg/m.  Wt Readings from Last 3 Encounters:  01/01/21 257 lb 12.8 oz (116.9 kg)  06/26/20 254 lb (115.2 kg)  04/30/20 254 lb 12.8 oz (115.6 kg)     GEN: Well nourished, well developed, in no acute distress. HEENT: normal. Neck: Supple, no JVD, carotid bruits, or masses. Cardiac: RRR, no murmurs, rubs, or gallops. No clubbing, cyanosis,  1+ pitting edema in left leg (surgery side) .  Radials/PT 2+ and equal bilaterally.  Respiratory:  Respirations regular and unlabored, clear to auscultation bilaterally. GI: Soft, nontender, nondistended. MS: No deformity or atrophy. Skin: Warm and dry, no rash. Neuro:  Strength and sensation are intact. Psych: Normal affect.  Assessment & Plan    Hypertension - Well controlled on current hypertensive regimen. At home he was 126/77 and usually remains in the 758I systolic. He is tolerating all his medications which include Norvasc, Diovan, and HCTZ.   Renal artery stenosis - Mild left renal artery stenosis 12/2019. He is asymptomatic and his BP is well controlled. Patient politely declined repeat renal US at this time but we will offer testing at next appointment in 6 months.   Hyperlipidemia - LDL 90, LDL goal < 70. He is on Atorvastatin 10mg . Last LFTs were normal. Repeat LDL in December when you see your PCP in December. If continues to be elevated we can increase Atorvastatin to 20mg .   Murmur - Known chronic finding. Asymptomatic.   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 No carotid bruit heard on today's exam. No lightheadedness or dizziness.   Disposition: Follow up in 6 month(s) with [name], MD or APP.     Signed, Elgie Collard, PA-C 01/01/2021, 2:18 PM [facility]

## 2021-01-01 NOTE — Patient Instructions (Signed)
Medication Instructions:  Your Physician recommend you continue on your current medication as directed.    *If you need a refill on your cardiac medications before your next appointment, please call your pharmacy*   Lab Work: None today- Will need Fasting Lipid Panel when you see your PCP next  If you have labs (blood work) drawn today and your tests are completely normal, you will receive your results only by: [place] (if you have MyChart) OR A paper copy in the mail If you have any lab test that is abnormal or we need to change your treatment, we will call you to review the results.   Testing/Procedures: None today   Follow-Up: At [facility], you and your health needs are our priority.  As part of our continuing mission to provide you with exceptional heart care, we have created designated Provider Care Teams.  These Care Teams include your primary Cardiologist (physician) and Advanced Practice Providers (APPs -  Physician Assistants and Nurse Practitioners) who all work together to provide you with the care you need, when you need it.  We recommend signing up for the patient portal called "MyChart".  Sign up information is provided on this After Visit Summary.  MyChart is used to connect with patients for Virtual Visits (Telemedicine).  Patients are able to view lab/test results, encounter notes, upcoming appointments, etc.  Non-urgent messages can be sent to your provider as well.   To learn more about what you can do with MyChart, go to [email or web address].    Your next appointment:   6 month(s)  The format for your next appointment:   In Person  Provider:   [name], MD, Laurann Montana, NP, or Coletta Memos, NP    Other Instructions You may hold your Aspirin for 1 week before your Total Knee Replacment

## 2022-03-08 ENCOUNTER — Encounter (HOSPITAL_BASED_OUTPATIENT_CLINIC_OR_DEPARTMENT_OTHER): Payer: Self-pay | Admitting: Cardiovascular Disease

## 2022-03-08 ENCOUNTER — Ambulatory Visit (INDEPENDENT_AMBULATORY_CARE_PROVIDER_SITE_OTHER): Payer: Medicare Other | Admitting: Cardiovascular Disease

## 2022-03-08 VITALS — BP 136/80 | HR 85 | Ht 77.0 in | Wt 263.6 lb

## 2022-03-08 DIAGNOSIS — E785 Hyperlipidemia, unspecified: Secondary | ICD-10-CM | POA: Diagnosis not present

## 2022-03-08 DIAGNOSIS — I701 Atherosclerosis of renal artery: Secondary | ICD-10-CM

## 2022-03-08 DIAGNOSIS — I10 Essential (primary) hypertension: Secondary | ICD-10-CM

## 2022-03-08 DIAGNOSIS — E7800 Pure hypercholesterolemia, unspecified: Secondary | ICD-10-CM | POA: Diagnosis not present

## 2022-03-08 DIAGNOSIS — R011 Cardiac murmur, unspecified: Secondary | ICD-10-CM

## 2022-03-08 NOTE — Progress Notes (Signed)
Cardiology Office Note  Date:  03/08/2022   ID:  ALEXANDRA POSADAS, DOB [birth year], MRN [id number]  PCP:  Leeroy Cha, MD  Cardiologist:   [name], MD   No chief complaint on file.  History of Present Illness: Douglas Proctor is a 82 y.o. male with hypertension and hyperlipidemia here for follow-up.  He was initially seen 03/2019 for management of hypertension. He was first diagnosed with hypertension in the 1970s.  It was previously well-controlled prior to the pandemic.  However lately he has not been able to go to the gym.  Prior to that he was started on Terazosin after his blood pressure was in the 150s over 90s at [facility].  Terazosin was discontinued as he had not seen any improvement in his blood pressure.  He was started on spironolactone instead.  He has tolerated this well.  His blood pressures have been mostly in the 130s to 140s over 80s.  He has started back exercising.  He uses the recumbent bike at least 3 to 4 days/week.  He rides it for 30 minutes and has no exertional chest pain or shortness of breath.  He has some mild lower extremity edema by the end of the day that improves with elevation and compression stockings.  He has no orthopnea or PND.  His wife does most of the cooking and she limits their salt intake.  He continues to help take care of his grandchildren and enjoys doing this.  Douglas Proctor blood pressure was slightly above goal.  He wanted to keep working on diet and exercise before making any changes.   He followed up with Coletta Memos on 06/2019 and his blood pressure is nearly at goal.  He was started on doxazosin 1 mg nightly and subsequently increased to 2 mg.  His blood pressure was well-controlled but he developed urinary symptoms.  BP was not controlled and didn't change with the addition of carvedilol.  Carvedilol was stopped and he started hydralazine. Douglas Proctor was not tolerating hydralazine.  This was discontinued and he was started on  clonidine.  Less than a week later he was in the ED after an episode of right leg numbness.  At home his blood pressure was also elevated.  By the time he got to the ED he was neurologically intact with no evidence of stroke.  His blood pressure had improved to 137/82.  He followed up with our pharmacist 11/16 and his blood pressure was 130/82.  It was noted that he had several numbers that were spiking and clonidine was discontinued.  Spironolactone was increased to 50 mg.  Secondary hypertension work-up revealed elevated serum norepinephrine and epinephrine levels. He had a CT of the abdomen 01/2020 and there were no adrenal adenomas. He followed up with Almyra Deforest on 03/2020 and his blood pressure was better controlled.    At the last visit he was doing well and blood pressures were mostly controlled. He saw Nicholes Rough, [place] 12/2021 and was recovering from knee replacement but otherwise well. Today, he is feeling good. He denies any recent issues with palpitations. At home his blood pressures have been ranging 130-142/73-85, and his heart rate has been "up and down." In clinic today his BP is 136/80. No lower blood pressures. He is conscientious of his sodium intake. 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 Every morning he wakes up and exercises. About 3-4 times a week he uses his stationary bike for 35 minutes. Routinely  he is wearing compression socks. Last Monday he reportedly had an echocardiogram performed at the [place]. He denies any chest pain, shortness of breath, or peripheral edema. No lightheadedness, headaches, syncope, orthopnea, or PND.  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    Past Surgical History:  Procedure Laterality Date   FOOT SURGERY Left    broken toe, bunion removed   SHOULDER SURGERY Right      Current Outpatient Medications  Medication Sig Dispense Refill   acetaminophen (TYLENOL) 500 MG tablet Take 500 mg by mouth as needed for moderate pain, headache or mild pain.     amLODipine (NORVASC) 10 MG tablet Take 10 mg by mouth daily.     ascorbic acid (VITAMIN C) 500 MG/5ML syrup Take by mouth daily.     aspirin 81 MG chewable tablet Chew 81 mg by mouth daily.     atorvastatin (LIPITOR) 10 MG tablet Take 10 mg by mouth daily.     azelastine (OPTIVAR) 0.05 % ophthalmic solution      Chlorpheniramine-Acetaminophen (CORICIDIN HBP COLD/FLU PO) Take by mouth as needed.      Cholecalciferol (VITAMIN D) 50 MCG (2000 UT) tablet Take 1 tablet (2,000 Units total) by mouth daily.     clotrimazole-betamethasone (LOTRISONE) lotion Apply topically 2 (two) times daily.     Docusate Sodium (DSS) 100 MG CAPS Take 1 tablet by mouth every other day.     fexofenadine (ALLEGRA) 180 MG tablet 1 tablet swallow whole with water; do not take with fruit juices.     fluticasone (FLONASE) 50 MCG/ACT nasal spray Place 1 spray into both nostrils 2 (two) times daily.     hydrochlorothiazide (HYDRODIURIL) 25 MG tablet Take 25 mg by mouth daily.     mometasone (ELOCON) 0.1 % cream 1 application a thin film to affected area     Multiple Vitamin (MULTIVITAMIN ADULT PO) Take by mouth.     nortriptyline (PAMELOR) 10 MG capsule Take 30 mg by mouth at bedtime.     polyethylene glycol (MIRALAX / GLYCOLAX) 17 g packet Take 17 g by mouth as needed for mild constipation.     sildenafil (VIAGRA) 50 MG tablet Take 1/2-1 tab prior to sexual activity     valsartan (DIOVAN) 160 MG tablet Take 1 tablet (160 mg total) by mouth 2 (two) times daily. 180 tablet 3   Vitamin E 180 MG (400  UNIT) CAPS Take 1 capsule by mouth daily.     XALATAN 0.005 % ophthalmic solution Place 1 drop into both eyes at bedtime.      No current facility-administered medications for this visit.    Allergies:   Clonidine derivatives, Lisinopril, Other, Oysters [shellfish allergy], and Spironolactone    Social History:  The patient  reports that he has never smoked. He has never used smokeless tobacco. He reports current alcohol use. He reports that he does not use drugs.   Family History:  The patient's family history includes CAD in his father; Other in his mother.    ROS:   Please see the history of present illness. All other systems are reviewed and negative.  PHYSICAL EXAM: VS:  BP 136/80 (BP Location: Left Arm, Patient Position: Sitting, Cuff Size: Large)   Pulse 85   Ht '6\' 5"'$  (1.956 m)   Wt 263 lb 9.6 oz (119.6 kg)   BMI 31.26 kg/m  , BMI Body mass index is 31.26 kg/m. GENERAL:  Well appearing HEENT:  Pupils equal round and reactive, fundi not visualized, oral mucosa unremarkable NECK:  No jugular venous distention, waveform within normal limits, carotid upstroke brisk and symmetric, no bruits LUNGS:  Clear to auscultation bilaterally HEART:  RRR.  PMI not displaced or sustained,S1 and S2 within normal limits, no S3, no S4, no clicks, no rubs, 2/6 systolic murmur ABD:  Flat, positive bowel sounds normal in frequency in pitch, no bruits, no rebound, no guarding, no midline pulsatile mass, no hepatomegaly, no splenomegaly EXT:  2 plus pulses throughout, no edema, no cyanosis no clubbing SKIN:  No rashes no nodules NEURO:  Cranial nerves II through XII grossly intact, motor grossly intact throughout PSYCH:  Cognitively intact, oriented to person place and time   EKG:  EKG is personally reviewed. 03/08/2022:  Sinus rhythm. Rate 85 bpm. First degree AV block. LAFB. 06/26/2020: EKG was not ordered. 01/08/2020: EKG was not ordered. 12/25/2019: Sinus rhythm.  Rate 92 bpm. 04/27/2019:  Sinus rhythm.  Rate 74 bpm.  First-degree AV block.  PVCs.  Recent Labs: No results found for requested labs within last 365 days.    Lipid Panel No results found for: "CHOL", "TRIG", "HDL", "CHOLHDL", "VLDL", "LDLCALC", "LDLDIRECT"    Wt Readings from Last 3 Encounters:  03/08/22 263 lb 9.6 oz (119.6 kg)  01/01/21 257 lb 12.8 oz (116.9 kg)  06/26/20 254 lb (115.2 kg)     12 Day Zio Monitor 03/04/2020: Quality: Fair.  Baseline artifact. Predominant rhythm: sinus rhythm Average heart rate: 77 bpm Max heart rate: 158 bpm Min heart rate: 48 bpm Pauses >2.5 seconds: none   There were several atrial runs up to 9 beats at a time Frequent PACs (2.1%) and couplets (<1%) Rare PVCs (<1%) and couplets  Up to 4 beats NSVT  Carotid Arterial Duplex 01/23/2020: Summary:  Right Carotid: Velocities in the right ICA are consistent with a 1-39%  stenosis.   Left Carotid: Velocities in the left ICA are consistent with a 1-39%  stenosis.   Vertebrals:  Bilateral vertebral arteries demonstrate antegrade flow.  Subclavians: Normal flow hemodynamics were seen in bilateral subclavian               arteries.   Vas US Renal Artery 01/01/2020: Summary:  Largest Aortic Diameter: 2.4 cm     Renal:     Right: Normal size right kidney. Normal right Resistive Index.         Normal cortical thickness of right kidney. No evidence of         right renal artery stenosis. RRV flow present. Cyst(s) noted.         See technologist notes.  Left:  Normal size of left kidney. Normal left Resistive Index.         Normal cortical thickness of the left kidney. 1-59% stenosis         of the left renal artery. Elevated velocities noted at the         origin likely due to vessel tortuosity. LRV flow present.         Cyst(s) noted. See technologist notes.  Mesenteric:  Normal Celiac artery and Superior Mesenteric artery findings.  Patent IVC.     ASSESSMENT AND PLAN:  Essential hypertension Blood  pressure still slightly above goal.  However, he has had significant reactions to send him to the hospital after trying other medications and is hesitant to try anything new.  This is understandable and his blood pressure is only slightly above goal.  Continue with amlodipine, hydrochlorothiazide and valsartan.  He will continue with his regular exercise and continue to limit the sodium in his diet.  Renal artery stenosis (HCC) Mild left renal artery stenosis on imaging in 2021.  Blood pressures are stable.  No intervention required.    HLD (hyperlipidemia) Come back for fasting lipids and a CMP.  Continue atorvastatin.  LDL goal less than 70.  Murmur He has a chronic heart murmur.  He reports that he had an echo done recently at the [facility] and will get a copy.  No symptoms of heart failure.  Plan -BP mostly in 130s at home and in clinic. Plan to defer medication changes at this time. Exercising well and watching his sodium intake.  Disposition:   FU with Ayomide Zuleta C. Oval Linsey, MD, [facility] in 1 year.  Medication Adjustments/Labs and Tests Ordered: Current medicines are reviewed at length with the patient today.  Concerns regarding medicines are outlined above.   Orders Placed This Encounter  Procedures   Lipid panel   Comprehensive metabolic panel   EKG [id number]   No orders of the defined types were placed in this encounter.  Patient Instructions  Medication Instructions:  Your physician recommends that you continue on your current medications as directed. Please refer to the Current Medication list given to you today.   *If you need a refill on your cardiac medications before your next appointment, please call your pharmacy*  Lab Work: FASTING LP/CMET SOON   If you have labs (blood work) drawn today and your tests are completely normal, you will receive your results only by: [place] (if you have MyChart) OR A paper copy in the mail If you have any lab test that is  abnormal or we need to change your treatment, we will call you to review the results.  Testing/Procedures: NONE  Follow-Up: At [facility], you and your health needs are our priority.  As part of our continuing mission to provide you with exceptional heart care, we have created designated Provider Care Teams.  These Care Teams include your primary Cardiologist (physician) and Advanced Practice Providers (APPs -  Physician Assistants and Nurse Practitioners) who all work together to provide you with the care you need, when you need it.  We recommend signing up for the patient portal called "MyChart".  Sign up information is provided on this After Visit Summary.  MyChart is used to connect with patients for Virtual Visits (Telemedicine).  Patients are able to view lab/test results, encounter notes, upcoming appointments, etc.  Non-urgent messages can be sent to your provider as well.   To learn more about what you can do with MyChart, go to [email or web address].    Your next appointment:   12 month(s)  Provider:   [name], MD   [facility] Stumpf,acting as a scribe for [name], MD.,have documented all relevant documentation on the behalf of [name], MD,as directed by  [name], MD while in the presence of [name], MD.  I, Douglas Oval Linsey, MD have reviewed all documentation for this visit.  The documentation of the exam, diagnosis, procedures, and orders on 03/08/2022 are all  accurate and complete.  Signed, Rayssa Atha C. Oval Linsey, MD, [facility]  03/08/2022 11:01 AM    [place]

## 2022-03-08 NOTE — Assessment & Plan Note (Addendum)
Mild left renal artery stenosis on imaging in 2021.  Blood pressures are stable.  No intervention required.

## 2022-03-08 NOTE — Patient Instructions (Signed)
Medication Instructions:  Your physician recommends that you continue on your current medications as directed. Please refer to the Current Medication list given to you today.   *If you need a refill on your cardiac medications before your next appointment, please call your pharmacy*  Lab Work: FASTING LP/CMET SOON   If you have labs (blood work) drawn today and your tests are completely normal, you will receive your results only by: [place] (if you have MyChart) OR A paper copy in the mail If you have any lab test that is abnormal or we need to change your treatment, we will call you to review the results.  Testing/Procedures: NONE  Follow-Up: At [facility], you and your health needs are our priority.  As part of our continuing mission to provide you with exceptional heart care, we have created designated Provider Care Teams.  These Care Teams include your primary Cardiologist (physician) and Advanced Practice Providers (APPs -  Physician Assistants and Nurse Practitioners) who all work together to provide you with the care you need, when you need it.  We recommend signing up for the patient portal called "MyChart".  Sign up information is provided on this After Visit Summary.  MyChart is used to connect with patients for Virtual Visits (Telemedicine).  Patients are able to view lab/test results, encounter notes, upcoming appointments, etc.  Non-urgent messages can be sent to your provider as well.   To learn more about what you can do with MyChart, go to [email or web address].    Your next appointment:   12 month(s)  Provider:   [name], MD

## 2022-03-08 NOTE — Assessment & Plan Note (Signed)
Come back for fasting lipids and a CMP.  Continue atorvastatin.  LDL goal less than 70.

## 2022-03-08 NOTE — Assessment & Plan Note (Signed)
Blood pressure still slightly above goal.  However, he has had significant reactions to send him to the hospital after trying other medications and is hesitant to try anything new.  This is understandable and his blood pressure is only slightly above goal.  Continue with amlodipine, hydrochlorothiazide and valsartan.  He will continue with his regular exercise and continue to limit the sodium in his diet.

## 2022-03-08 NOTE — Assessment & Plan Note (Signed)
He has a chronic heart murmur.  He reports that he had an echo done recently at the [facility] and will get a copy.  No symptoms of heart failure.

## 2022-03-12 LAB — LIPID PANEL
Chol/HDL Ratio: 2.4 ratio (ref 0.0–5.0)
Cholesterol, Total: 168 mg/dL (ref 100–199)
HDL: 70 mg/dL (ref >39)
LDL Chol Calc (NIH): 86 mg/dL (ref 0–99)
Triglycerides: 63 mg/dL (ref 0–149)
VLDL Cholesterol Cal: 12 mg/dL (ref 5–40)

## 2022-03-12 LAB — COMPREHENSIVE METABOLIC PANEL
ALT: 22 IU/L (ref 0–44)
AST: 24 IU/L (ref 0–40)
Albumin/Globulin Ratio: 1.2 (ref 1.2–2.2)
Albumin: 4.3 g/dL (ref 3.8–4.8)
Alkaline Phosphatase: 84 IU/L (ref 44–121)
BUN/Creatinine Ratio: 8 — ABNORMAL LOW (ref 10–24)
BUN: 9 mg/dL (ref 8–27)
Bilirubin Total: 0.3 mg/dL (ref 0.0–1.2)
CO2: 25 mmol/L (ref 20–29)
Calcium: 9.7 mg/dL (ref 8.6–10.2)
Chloride: 100 mmol/L (ref 96–106)
Creatinine, Ser: 1.08 mg/dL (ref 0.76–1.27)
Globulin, Total: 3.5 g/dL (ref 1.5–4.5)
Glucose: 101 mg/dL — ABNORMAL HIGH (ref 70–99)
Potassium: 3.7 mmol/L (ref 3.5–5.2)
Sodium: 139 mmol/L (ref 134–144)
Total Protein: 7.8 g/dL (ref 6.0–8.5)
eGFR: 69 mL/min/{1.73_m2} (ref >59)

## 2022-03-15 ENCOUNTER — Encounter (HOSPITAL_BASED_OUTPATIENT_CLINIC_OR_DEPARTMENT_OTHER): Payer: Self-pay | Admitting: Cardiovascular Disease

## 2022-03-18 ENCOUNTER — Telehealth (HOSPITAL_BASED_OUTPATIENT_CLINIC_OR_DEPARTMENT_OTHER): Payer: Self-pay | Admitting: *Deleted

## 2022-03-18 DIAGNOSIS — I10 Essential (primary) hypertension: Secondary | ICD-10-CM

## 2022-03-18 DIAGNOSIS — E785 Hyperlipidemia, unspecified: Secondary | ICD-10-CM

## 2022-03-18 DIAGNOSIS — Z5181 Encounter for therapeutic drug level monitoring: Secondary | ICD-10-CM

## 2022-03-18 MED ORDER — ATORVASTATIN CALCIUM 20 MG PO TABS: 20.0000 mg | ORAL_TABLET | Freq: Every day | ORAL | 3 refills | Status: AC

## 2022-03-18 NOTE — Telephone Encounter (Signed)
Advised patient of lab results  Sent new Rx to pharmacy and mailed lab orders

## 2022-03-18 NOTE — Telephone Encounter (Signed)
-----  Message from Loel Dubonnet, NP sent at 03/15/2022  8:21 AM EST ----- Normal kidneys, electrolytes, liver.  Cholesterol panel reveals LDL (bad cholesterol) of 86 which is above goal of 70.  Increase atorvastatin to 20 mg daily with repeat FLP/LFT in 2 to 3 months

## 2022-05-15 IMAGING — CT CT HEAD W/O CM
4 series · 16 of 47 positions shown, 18 images · non-contrast
Comparison: 03/02/2016

CLINICAL DATA: Bilateral facial tingling and dizziness.
Hypertensive.

EXAM:
CT HEAD WITHOUT CONTRAST
TECHNIQUE: Contiguous axial images were obtained from the base of the skull
through the vertex without intravenous contrast.

[Series 3: head wo · axial · 0.46mm/px · z∈[-98,+22]mm · 7 of 34 slices shown, 9 images]
[im 5/34  brain]
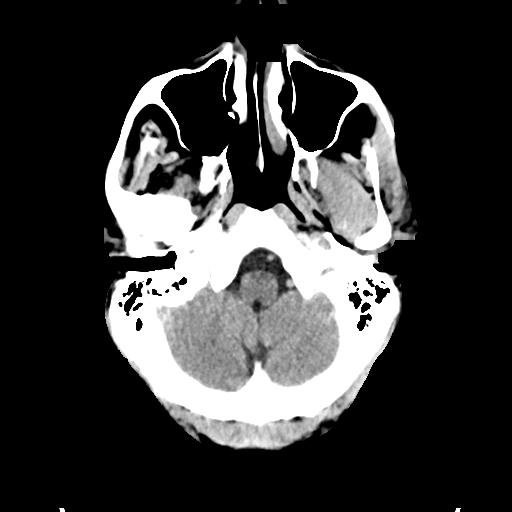
[im 5/34  bone]
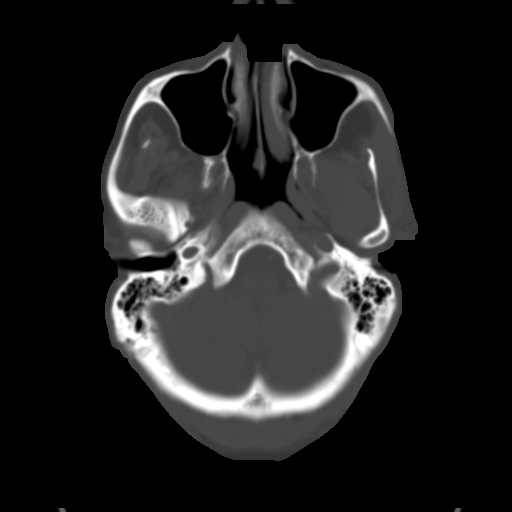
[im 9/34  brain]
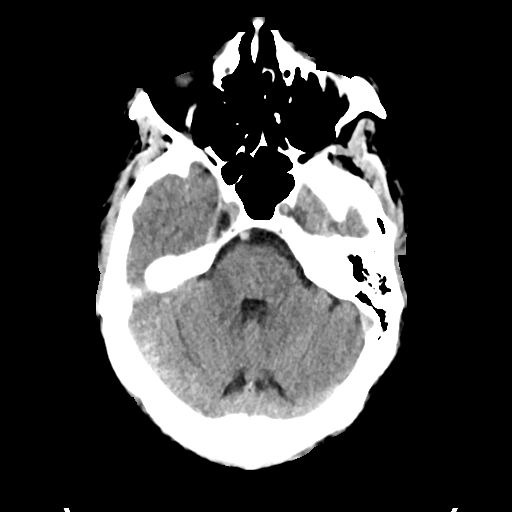
[im 13/34  brain]
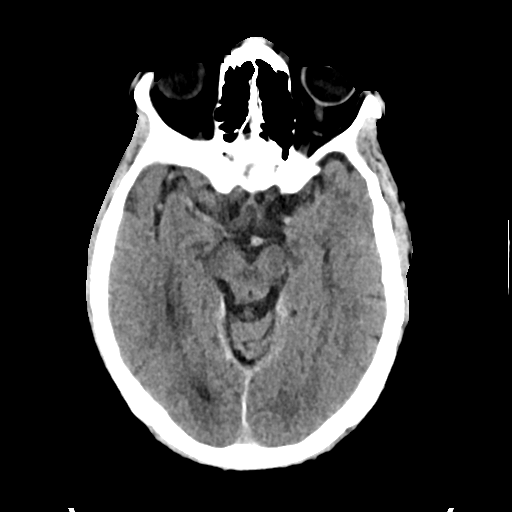
[im 17/34  brain]
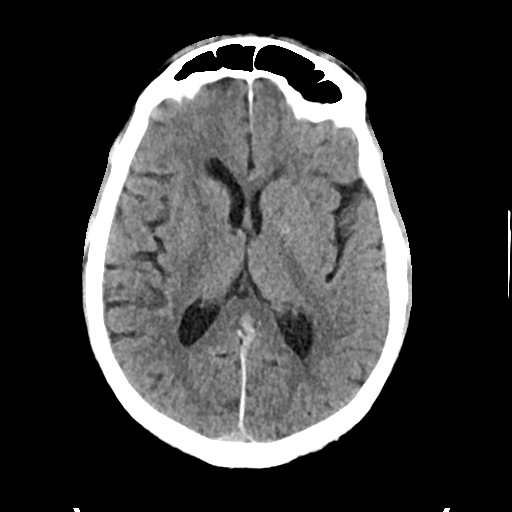
[im 21/34  brain]
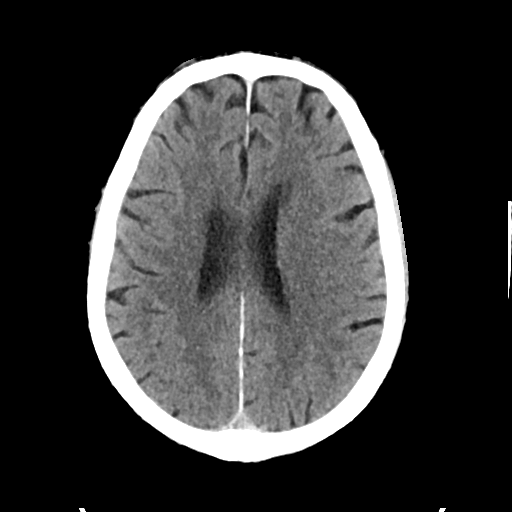
[im 21/34  bone]
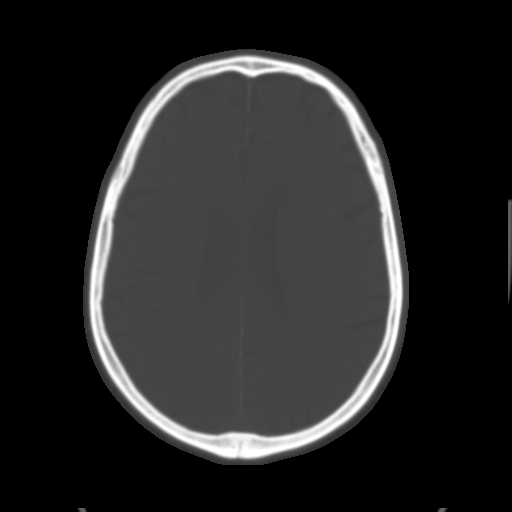
[im 25/34  brain]
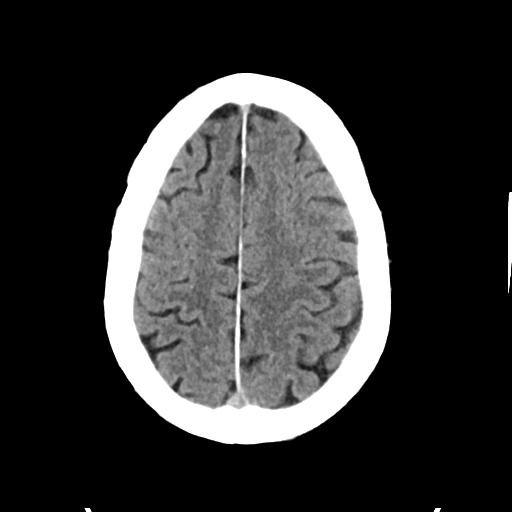
[im 29/34  brain]
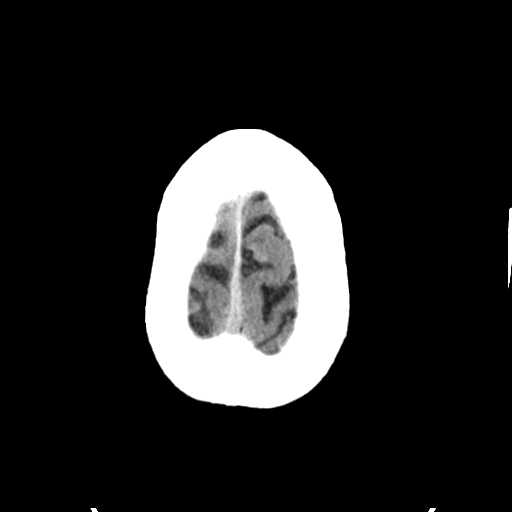

[Series 4: head bone · axial · 0.46mm/px · z∈[-102,-70]mm · 3 of 84 slices shown]
[im 9/84  bone]
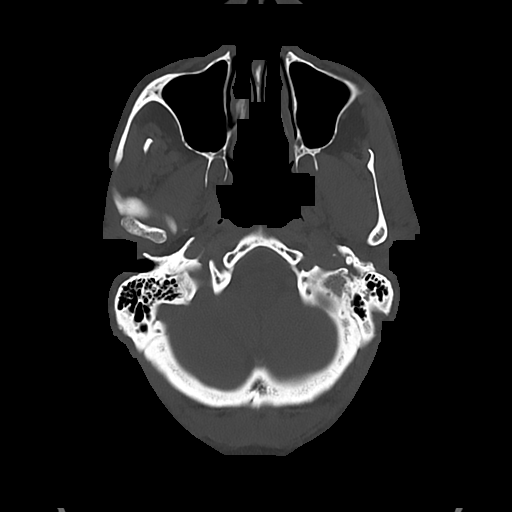
[im 17/84  bone]
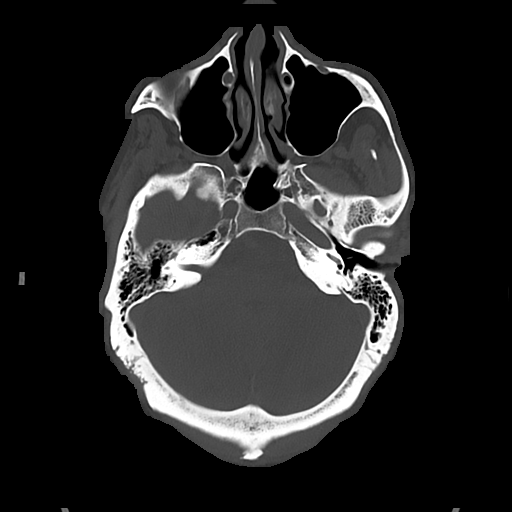
[im 25/84  bone]
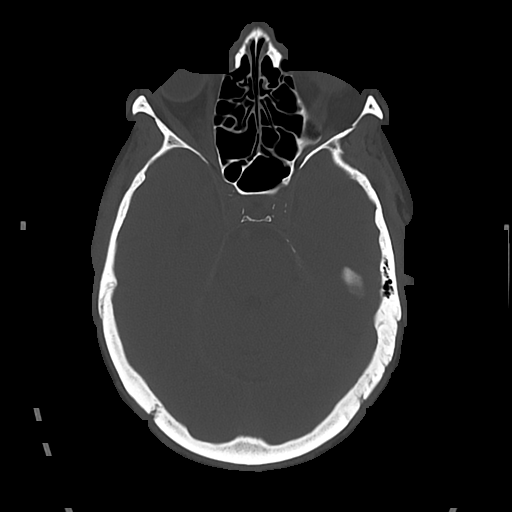

[Series 5: cor soft · coronal · 0.36mm/px · 3 of 75 slices shown]
[im 25/75  brain]
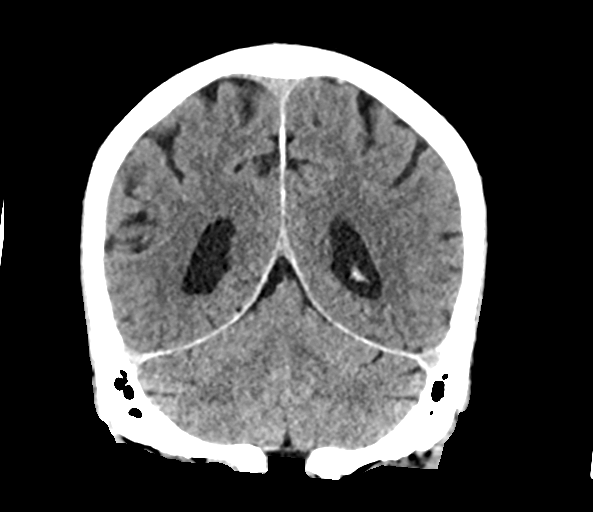
[im 33/75  brain]
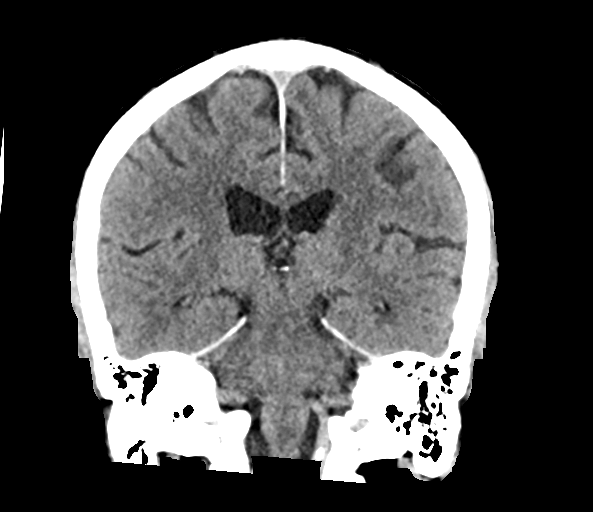
[im 42/75  brain]
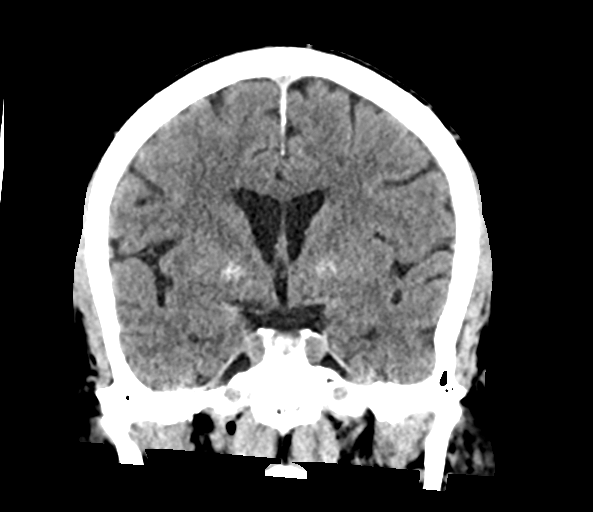

[Series 6: sag soft · sagittal · 0.37mm/px · 3 of 57 slices shown]
[im 19/57  brain]
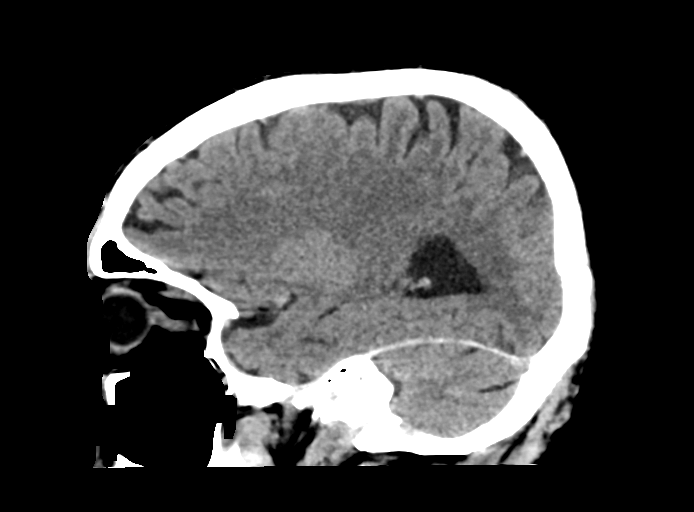
[im 29/57  brain]
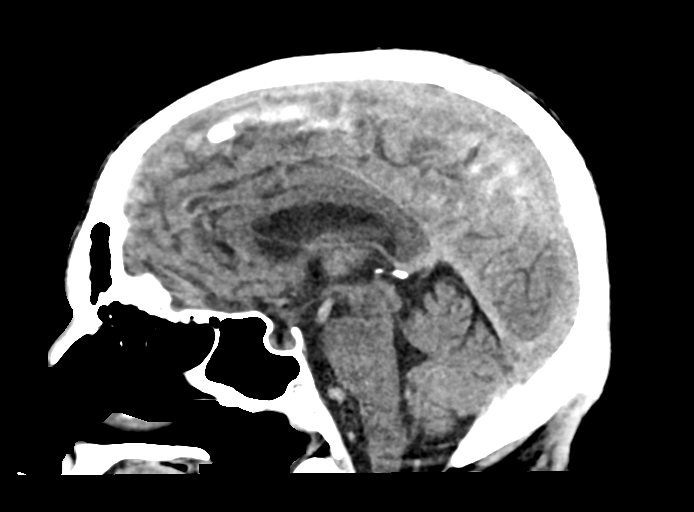
[im 38/57  brain]
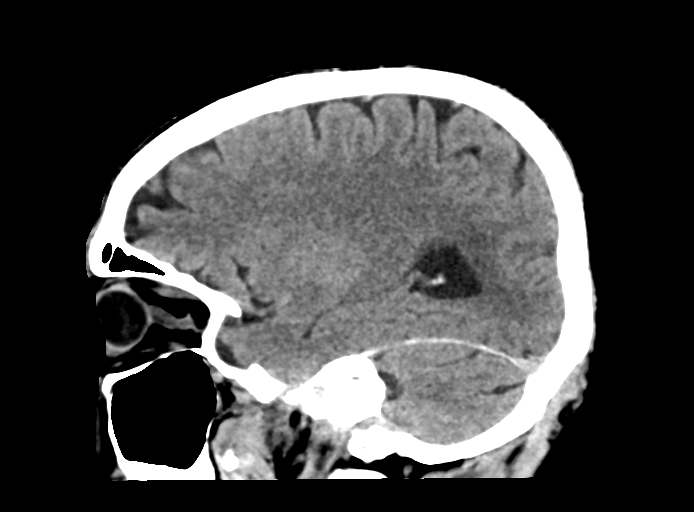

[16 of 47 positions shown; findings below may reference images not displayed]

FINDINGS: Brain: There is no evidence of an acute infarct, intracranial
hemorrhage, mass, midline shift, or extra-axial fluid collection.
There is chronic thin diffuse dural calcification along the falx and
tentorium. The ventricles and sulci are within normal limits for
age. No significant white matter disease is seen for age.

Vascular: Calcified atherosclerosis at the skull base. No hyperdense
vessel.

Skull: No fracture or suspicious osseous lesion.

Sinuses/Orbits: Visualized paranasal sinuses and mastoid air cells
are clear. Unremarkable orbits.

Other: None.
IMPRESSION: No evidence of acute intracranial abnormality.

## 2022-06-02 LAB — HEPATIC FUNCTION PANEL
ALT: 22 IU/L (ref 0–44)
AST: 26 IU/L (ref 0–40)
Albumin: 4.3 g/dL (ref 3.7–4.7)
Alkaline Phosphatase: 85 IU/L (ref 44–121)
Bilirubin Total: 0.4 mg/dL (ref 0.0–1.2)
Bilirubin, Direct: <0.1 mg/dL (ref 0.00–0.40)
Total Protein: 7.5 g/dL (ref 6.0–8.5)

## 2022-06-02 LAB — LIPID PANEL
Chol/HDL Ratio: 2.4 ratio (ref 0.0–5.0)
Cholesterol, Total: 168 mg/dL (ref 100–199)
HDL: 71 mg/dL (ref >39)
LDL Chol Calc (NIH): 84 mg/dL (ref 0–99)
Triglycerides: 67 mg/dL (ref 0–149)
VLDL Cholesterol Cal: 13 mg/dL (ref 5–40)

## 2023-03-11 ENCOUNTER — Encounter (HOSPITAL_BASED_OUTPATIENT_CLINIC_OR_DEPARTMENT_OTHER): Payer: Self-pay

## 2023-03-16 ENCOUNTER — Ambulatory Visit (INDEPENDENT_AMBULATORY_CARE_PROVIDER_SITE_OTHER): Payer: Medicare Other | Admitting: Cardiovascular Disease

## 2023-03-16 ENCOUNTER — Encounter (HOSPITAL_BASED_OUTPATIENT_CLINIC_OR_DEPARTMENT_OTHER): Payer: Self-pay | Admitting: Cardiovascular Disease

## 2023-03-16 VITALS — BP 136/80 | Ht 77.5 in | Wt 261.6 lb

## 2023-03-16 DIAGNOSIS — E7800 Pure hypercholesterolemia, unspecified: Secondary | ICD-10-CM | POA: Diagnosis not present

## 2023-03-16 DIAGNOSIS — Z5181 Encounter for therapeutic drug level monitoring: Secondary | ICD-10-CM | POA: Diagnosis not present

## 2023-03-16 DIAGNOSIS — I701 Atherosclerosis of renal artery: Secondary | ICD-10-CM

## 2023-03-16 DIAGNOSIS — I10 Essential (primary) hypertension: Secondary | ICD-10-CM

## 2023-03-16 MED ORDER — CHLORTHALIDONE 25 MG PO TABS: 25.0000 mg | ORAL_TABLET | Freq: Every day | ORAL | 3 refills | Status: AC

## 2023-03-16 NOTE — Progress Notes (Signed)
Cardiology Office Note:  .   Date:  03/16/2023  ID:  Douglas Proctor, DOB 1940-08-23, MRN [id number] PCP: Lorenda Ishihara, MD   [facility] Providers Cardiologist:  Chilton Si, MD    History of Present Illness: .   Douglas Proctor is a 83 y.o. male with hypertension and hyperlipidemia here for follow-up.  He was initially seen 03/2019 for management of hypertension. He was first diagnosed with hypertension in the 1970s.  It was previously well-controlled prior to the pandemic. He was started on Terazosin after his blood pressure was in the 150s over 90s at [facility].  Terazosin was discontinued as he had not seen any improvement in his blood pressure.  He was started on spironolactone instead.  This was subsequently discontinued due to intolerance.  He developed urinary symptoms with doxazosin.  He tried several other antihypertensives and had adverse reactions, including spikes in his blood pressure and neurologic symptoms that led him to feel like he was having a stroke.  Secondary hypertension work-up revealed elevated serum norepinephrine and epinephrine levels. He had a CT of the abdomen 01/2020 and there were no adrenal adenomas. He followed up with Azale[facility] Course on 03/2020 and his blood pressure was better controlled.     At his visit 02/2022 blood pressures were slightly above goal but he was feeling well and reluctant to try anything new given his history of adverse reactions in the past.  He was noted to have a murmur but had a recent echocardiogram at the [facility]  Douglas Proctor reports that he has been doing well.  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  Recently symptoms have worsened.    He has fluctuating blood pressure readings at home, ranging from the upper 120s to mid-140s systolic and 70s to 80s diastolic, with the highest recorded at 158/78. Current medications include amlodipine, hydrochlorothiazide, and valsartan taken twice  daily. Blood pressure varies depending on the time of day.  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 This condition sometimes makes it difficult to hear the television. He has been evaluated by both [facility] and [facility] doctors, with varying assessments of his hearing loss.  He maintains an active lifestyle, exercising every morning with activities including stretching, cycling on a recumbent bike, sit-ups, and weight lifting. No issues with fatigue, chest pain, or dizziness during exercise. Resting heart rate ranges from 55 to 88 bpm. No lightheadedness or dizziness.     Prior Antihypertensives: Terazosin Spironolactone Doxazosin Carvedilol Hydralazine Clonidine Douglas Proctor reports that he has been doing well.  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  Recently symptoms have worsened.    He has fluctuating blood pressure readings at home, ranging from the upper 120s to mid-140s systolic and 70s to 80s diastolic, with the highest recorded at 158/78. Current medications include amlodipine, hydrochlorothiazide, and valsartan taken twice daily. Blood pressure varies depending on the time of day.  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 This condition sometimes makes it difficult to hear the television. He has been evaluated by both [facility] and [facility] doctors, with varying assessments of his hearing loss.  He maintains an active lifestyle, exercising every morning with activities including stretching, cycling on a recumbent bike, sit-ups, and weight lifting. No issues with  fatigue, chest pain, or dizziness during exercise. Resting heart rate ranges from 55 to 88 bpm. No lightheadedness or dizziness.     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 GENERAL:  Well appearing HEENT: Pupils equal round and reactive, fundi not visualized, oral mucosa unremarkable NECK:  No jugular venous distention, waveform within normal limits, carotid upstroke brisk and symmetric, no bruits, no thyromegaly LUNGS:  Clear to auscultation bilaterally HEART:  RRR.  PMI not displaced or sustained,S1 and S2 within normal limits, no S3, no S4, no clicks, no rubs, no murmurs ABD:  Flat, positive bowel sounds normal in frequency in pitch, no bruits, no rebound, no guarding, no midline pulsatile mass, no hepatomegaly, no splenomegaly EXT:  2 plus pulses throughout, no edema, no cyanosis no clubbing SKIN:  No rashes no nodules NEURO:  Cranial nerves II through XII grossly intact, motor grossly intact throughout PSYCH:  Cognitively intact, oriented to person place and time   ASSESSMENT AND PLAN: .    # Hypertension Blood pressure readings at home range from upper 120s to mid 140s systolic and 70s to 80s diastolic. Currently on Amlodipine, Hydrochlorothiazide, and Valsartan. -Switch Hydrochlorothiazide to Chlorthalidone 25mg  for potential increased efficacy. -Check basic metabolic panel 1 week after starting Chlorthalidone to ensure potassium stability. -Continue home blood pressure monitoring.  # Hyperlipidemia Currently on 20mg  atorvastatin with no side effects. -Check lipid  panel 1 week after starting Chlorthalidone.  # Tinnitus Reports worsening tinnitus, particularly in left ear, affecting ability to hear conversations. -Documented for reference, no specific plan discussed.  Follow-up Plan to follow up in a few months to ensure stability after medication change. If issues arise, patient to contact office.       Signed, Chilton Si, MD

## 2023-03-16 NOTE — Patient Instructions (Addendum)
Medication Instructions:  STOP hydrochlorothiazide  START CHLORTHALIDONE 25 MG DAILY   *If you need a refill on your cardiac medications before your next appointment, please call your pharmacy*  Lab Work: FASTING LP/CMET 1 WEEK AFTER STARTING CHLORTHALIDONE   If you have labs (blood work) drawn today and your tests are completely normal, you will receive your results only by: MyChart Message (if you have MyChart) OR A paper copy in the mail If you have any lab test that is abnormal or we need to change your treatment, we will call you to review the results.  Testing/Procedures: NONE  Follow-Up: At [facility], you and your health needs are our priority.  As part of our continuing mission to provide you with exceptional heart care, we have created designated Provider Care Teams.  These Care Teams include your primary Cardiologist (physician) and Advanced Practice Providers (APPs -  Physician Assistants and Nurse Practitioners) who all work together to provide you with the care you need, when you need it.  We recommend signing up for the patient portal called "MyChart".  Sign up information is provided on this After Visit Summary.  MyChart is used to connect with patients for Virtual Visits (Telemedicine).  Patients are able to view lab/test results, encounter notes, upcoming appointments, etc.  Non-urgent messages can be sent to your provider as well.   To learn more about what you can do with MyChart, go to [email or web address].    Your next appointment:   3 month(s)  Provider:   Gillian Shields, NP or PHARM D      DR [facility] 1 YEAR

## 2023-03-17 ENCOUNTER — Telehealth (HOSPITAL_BASED_OUTPATIENT_CLINIC_OR_DEPARTMENT_OTHER): Payer: Self-pay | Admitting: *Deleted

## 2023-03-17 NOTE — Telephone Encounter (Signed)
Faxed Dr Leonides Sake last office note to [facility] outpatient clinic [phone], confirmation received

## 2023-03-29 LAB — COMPREHENSIVE METABOLIC PANEL
ALT: 20 [IU]/L (ref 0–44)
AST: 27 [IU]/L (ref 0–40)
Albumin: 4.1 g/dL (ref 3.7–4.7)
Alkaline Phosphatase: 94 [IU]/L (ref 44–121)
BUN/Creatinine Ratio: 15 (ref 10–24)
BUN: 15 mg/dL (ref 8–27)
Bilirubin Total: 0.3 mg/dL (ref 0.0–1.2)
CO2: 27 mmol/L (ref 20–29)
Calcium: 9.6 mg/dL (ref 8.6–10.2)
Chloride: 101 mmol/L (ref 96–106)
Creatinine, Ser: 1.03 mg/dL (ref 0.76–1.27)
Globulin, Total: 3.3 g/dL (ref 1.5–4.5)
Glucose: 101 mg/dL — ABNORMAL HIGH (ref 70–99)
Potassium: 4.2 mmol/L (ref 3.5–5.2)
Sodium: 141 mmol/L (ref 134–144)
Total Protein: 7.4 g/dL (ref 6.0–8.5)
eGFR: 73 mL/min/{1.73_m2} (ref >59)

## 2023-03-29 LAB — LIPID PANEL
Chol/HDL Ratio: 2.3 {ratio} (ref 0.0–5.0)
Cholesterol, Total: 161 mg/dL (ref 100–199)
HDL: 71 mg/dL (ref >39)
LDL Chol Calc (NIH): 75 mg/dL (ref 0–99)
Triglycerides: 83 mg/dL (ref 0–149)
VLDL Cholesterol Cal: 15 mg/dL (ref 5–40)

## 2023-04-06 ENCOUNTER — Encounter (HOSPITAL_BASED_OUTPATIENT_CLINIC_OR_DEPARTMENT_OTHER): Payer: Self-pay

## 2023-06-07 ENCOUNTER — Encounter (HOSPITAL_BASED_OUTPATIENT_CLINIC_OR_DEPARTMENT_OTHER): Payer: Self-pay | Admitting: Family

## 2023-06-07 ENCOUNTER — Ambulatory Visit (HOSPITAL_BASED_OUTPATIENT_CLINIC_OR_DEPARTMENT_OTHER): Payer: Medicare Other | Admitting: Family

## 2023-06-07 VITALS — BP 136/76 | HR 86 | Ht 77.0 in | Wt 253.4 lb

## 2023-06-07 DIAGNOSIS — E782 Mixed hyperlipidemia: Secondary | ICD-10-CM

## 2023-06-07 DIAGNOSIS — I10 Essential (primary) hypertension: Secondary | ICD-10-CM

## 2023-06-07 DIAGNOSIS — I701 Atherosclerosis of renal artery: Secondary | ICD-10-CM

## 2023-06-07 NOTE — Progress Notes (Signed)
 Cardiology Office Note:  .   Date:  06/07/2023  ID:  Douglas Proctor, DOB Mar 31, 1940, MRN [id number] PCP: Arva Lathe, MD  [facility] Providers Cardiologist:  Maudine Sos, MD    History of Present Illness: .   Douglas Proctor is a 83 y.o. male with history of HTN, HLD, tinnitus  Initially diagnosed with hypertension in the 1970s.  Adverse reactions to several antihypertensives including spikes in BP, neurologic symptoms that led him to feel he was having a stroke, detailed below.  Secondary hypertension workup revealed elevated serum norepinephrine and epinephrine  levels.  CT of the abdomen 01/2020 with no adrenal adenomas.  He was noted to have a murmur 02/2022 but had a recent echocardiogram at the [place].  At last visit 03/16/23 hydrochlorothiazide transitioned to Chlorthalidone  25mg  daily for increased efficacy. Amlodipine , Valsartan  continued.  03/28/2023 total cholesterol 131, triglycerides 83, HDL 71, LDL 75, creatinine 1.03, GFR 73, calcium  9.6, AST 27, ALT 20.  Presents today for follow-up with his wife.  Recently enjoyed a Viking cruise to [place], [place]. Feeling well since last seen. Reports no shortness of breath nor dyspnea on exertion. Reports no chest pain, pressure, or tightness. No edema, orthopnea, PND. Reports no palpitations.  BP at home since transition from hydrochlorothiazide to chlorthalidone :  133/82, 130/80, 140/82, 133/77, 126/73. Notes a gradual down trend.   Prior Antihypertensives: Terazosin-no improvement in BP Spironolactone -intolerance Doxazosin -urinary symptoms Carvedilol  Hydralazine  Clonidine   ROS: Please see the history of present illness.    All other systems reviewed and are negative.   Studies Reviewed: .        Cardiac Studies & Procedures   ______________________________________________________________________________________________        Douglas Proctor  LONG TERM MONITOR-LIVE TELEMETRY (3-14 DAYS) 03/04/2020  Narrative 12  Day ZioMonitor  Quality: Fair.  Baseline artifact. Predominant rhythm: sinus rhythm Average heart rate: 77 bpm Max heart rate: 158 bpm Min heart rate: 48 bpm Pauses >2.5 seconds: none  There were several atrial runs up to 9 beats at a time Frequent PACs (2.1%) and couplets (<1%) Rare PVCs (<1%) and couplets Up to 4 beats NSVT  Tiffany C. Theodis Fiscal, MD, [facility] 03/05/2020 3:51 PM       ______________________________________________________________________________________________      Risk Assessment/Calculations:             Physical Exam:   VS:  BP 136/76   Pulse 86   Ht 6\' 5"  (1.956 m)   Wt 253 lb 6.4 oz (114.9 kg)   SpO2 95%   BMI 30.05 kg/m    Wt Readings from Last 3 Encounters:  06/07/23 253 lb 6.4 oz (114.9 kg)  03/16/23 261 lb 9.6 oz (118.7 kg)  03/08/22 263 lb 9.6 oz (119.6 kg)    GEN: Well nourished, well developed in no acute distress NECK: No JVD; No carotid bruits CARDIAC: RRR, no murmurs, rubs, gallops RESPIRATORY:  Clear to auscultation without rales, wheezing or rhonchi  ABDOMEN: Soft, non-tender, non-distended EXTREMITIES:  No edema; No deformity   ASSESSMENT AND PLAN: .    HTN- BP control much improved since transition from HCTZ to chlorthalidone  25mg  daily, continue same.  Average home BP 132/79 and gradually downtrending. Continue amlodipine  10 mg daily, valsartan  160 mg twice daily.  Previous medication intolerances detailed above. Recommend aiming for 150 minutes of moderate intensity activity per week and following a heart healthy diet.   Discussed to monitor BP at home at least 2 hours after medications and sitting for 5-10 minutes.  HLD-continue atorvastatin  20 mg daily.  03/2023 LDL 75 which is at goal <100.  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  Continue atorvastatin  20 mg daily. As BP controlled, will defer repeat imaging. If BP persistently elevated in future,  consider repeat imaging.        Dispo: follow up 02/2024 with Dr. Theodis Fiscal  Signed, Clearnce Curia, NP

## 2023-06-07 NOTE — Patient Instructions (Signed)
 Medication Instructions:  Your physician recommends that you continue on your current medications as directed. Please refer to the Current Medication list given to you today.  *If you need a refill on your cardiac medications before your next appointment, please call your pharmacy*  Follow-Up: At [facility], you and your health needs are our priority.  As part of our continuing mission to provide you with exceptional heart care, we have created designated Provider Care Teams.  These Care Teams include your primary Cardiologist (physician) and Advanced Practice Providers (APPs -  Physician Assistants and Nurse Practitioners) who all work together to provide you with the care you need, when you need it.  We recommend signing up for the patient portal called "MyChart".  Sign up information is provided on this After Visit Summary.  MyChart is used to connect with patients for Virtual Visits (Telemedicine).  Patients are able to view lab/test results, encounter notes, upcoming appointments, etc.  Non-urgent messages can be sent to your provider as well.   To learn more about what you can do with MyChart, go to [email or web address].    Your next appointment:   January 2025  Provider:   Dr. Theodis Fiscal

## 2024-03-27 ENCOUNTER — Ambulatory Visit (HOSPITAL_BASED_OUTPATIENT_CLINIC_OR_DEPARTMENT_OTHER): Admitting: Family
